# Patient Record
Sex: Female | Born: 1985 | Race: Black or African American | Hispanic: No | Marital: Married | Smoking: Never smoker
Health system: Southern US, Community
[De-identification: ages and names within clinical notes are randomized; demographics above are authoritative.]

## PROBLEM LIST (undated history)

## (undated) ENCOUNTER — Inpatient Hospital Stay (HOSPITAL_COMMUNITY): Payer: Self-pay

## (undated) DIAGNOSIS — K5900 Constipation, unspecified: Secondary | ICD-10-CM

## (undated) DIAGNOSIS — O99119 Other diseases of the blood and blood-forming organs and certain disorders involving the immune mechanism complicating pregnancy, unspecified trimester: Secondary | ICD-10-CM

## (undated) DIAGNOSIS — D696 Thrombocytopenia, unspecified: Secondary | ICD-10-CM

## (undated) DIAGNOSIS — B1910 Unspecified viral hepatitis B without hepatic coma: Secondary | ICD-10-CM

## (undated) HISTORY — PX: TOOTH EXTRACTION: SUR596

---

## 2013-04-21 ENCOUNTER — Inpatient Hospital Stay (HOSPITAL_COMMUNITY): Payer: Self-pay

## 2013-04-21 ENCOUNTER — Encounter (HOSPITAL_COMMUNITY): Payer: Self-pay | Admitting: *Deleted

## 2013-04-21 ENCOUNTER — Inpatient Hospital Stay (HOSPITAL_COMMUNITY)
Admission: AD | Admit: 2013-04-21 | Discharge: 2013-04-21 | Disposition: A | Discharge status: Home / Self Care | Payer: Self-pay | Source: Ambulatory Visit | Attending: Obstetrics & Gynecology | Admitting: Obstetrics & Gynecology

## 2013-04-21 DIAGNOSIS — R1031 Right lower quadrant pain: Secondary | ICD-10-CM | POA: Insufficient documentation

## 2013-04-21 DIAGNOSIS — O26899 Other specified pregnancy related conditions, unspecified trimester: Secondary | ICD-10-CM

## 2013-04-21 DIAGNOSIS — O209 Hemorrhage in early pregnancy, unspecified: Secondary | ICD-10-CM

## 2013-04-21 DIAGNOSIS — O26859 Spotting complicating pregnancy, unspecified trimester: Secondary | ICD-10-CM | POA: Insufficient documentation

## 2013-04-21 DIAGNOSIS — R1032 Left lower quadrant pain: Secondary | ICD-10-CM | POA: Insufficient documentation

## 2013-04-21 DIAGNOSIS — O99891 Other specified diseases and conditions complicating pregnancy: Secondary | ICD-10-CM | POA: Insufficient documentation

## 2013-04-21 DIAGNOSIS — Z3201 Encounter for pregnancy test, result positive: Secondary | ICD-10-CM

## 2013-04-21 HISTORY — DX: Unspecified viral hepatitis B without hepatic coma: B19.10

## 2013-04-21 HISTORY — DX: Constipation, unspecified: K59.00

## 2013-04-21 LAB — URINALYSIS, ROUTINE W REFLEX MICROSCOPIC
Bilirubin Urine: NEGATIVE
Hgb urine dipstick: NEGATIVE
Ketones, ur: NEGATIVE mg/dL
Leukocytes, UA: NEGATIVE
Nitrite: NEGATIVE
Protein, ur: NEGATIVE mg/dL
Specific Gravity, Urine: 1.025 (ref 1.005–1.030)
Urobilinogen, UA: 0.2 mg/dL (ref 0.0–1.0)

## 2013-04-21 LAB — CBC
HCT: 35 % — ABNORMAL LOW (ref 36.0–46.0)
MCH: 25.7 pg — ABNORMAL LOW (ref 26.0–34.0)
MCHC: 33.7 g/dL (ref 30.0–36.0)
MCV: 76.3 fL — ABNORMAL LOW (ref 78.0–100.0)
Platelets: 115 10*3/uL — ABNORMAL LOW (ref 150–400)
RBC: 4.59 MIL/uL (ref 3.87–5.11)
RDW: 13.5 % (ref 11.5–15.5)
WBC: 5.3 10*3/uL (ref 4.0–10.5)

## 2013-04-21 LAB — OB RESULTS CONSOLE GC/CHLAMYDIA
CHLAMYDIA, DNA PROBE: NEGATIVE
GC PROBE AMP, GENITAL: NEGATIVE

## 2013-04-21 LAB — WET PREP, GENITAL
Trich, Wet Prep: NONE SEEN
Yeast Wet Prep HPF POC: NONE SEEN

## 2013-04-21 NOTE — MAU Provider Note (Signed)
Attestation of Attending Supervision of Advanced Practitioner (PA/CNM/NP): Evaluation and management procedures were performed by the Advanced Practitioner under my supervision and collaboration.  I have reviewed the Advanced Practitioner's note and chart, and I agree with the management and plan.  Vedha Tercero, MD, FACOG Attending Obstetrician & Gynecologist Faculty Practice, [facility]  

## 2013-04-21 NOTE — MAU Note (Signed)
Patient states she has had a positive home pregnancy test. States she has had pink spotting every day for the past 4 days between 0500-0600. Has not bleeding at this time or any other times during the day. Has some mild pain off and on but none now.

## 2013-04-21 NOTE — MAU Provider Note (Signed)
History     CSN: [id number]  Arrival date and time: 04/21/13 4098   First Provider Initiated Contact with Patient 04/21/13 1058      Chief Complaint  Patient presents with  . Possible Pregnancy  . Vaginal Bleeding   HPI  Andrea Serrano is a 27 y.o. [id number] [redacted]w[redacted]d with a history of two elective abortions, 5 years of regular "constipation," and hepatitis B. She presents today endorsing 4 days of morning vaginal spotting which she describes as "only a few small pink drops in the morning." She had a positive home urine pregnancy test which was confirmed with in-house urine pregnancy screening at triage. LMP 03/22/13. The patient also endorses that she has experienced transient LLQ and RLQ pain that seems to alternate from side to side and come and go. She is not experiencing any abdominal pain that the time of presentation. She wants to keep this pregnancy, but does not seem to understand much about prenatal care--this was discussed, as well as the clinic at [facility]. The patient endorses a monogamous relationship with her husband of three years. She has not had Pap testing ever previously. She denies any medications; prescription or OTC. Patient family history remarkable for paternal HTN. The patient drinks socially occasionally, is a never smoker, and does not endorse illicit drug use.   OB History   Grav Para Term Preterm Abortions TAB SAB Ect Mult Living   3    2 2     0      Past Medical History  Diagnosis Date  . Hepatitis B   . Constipation     Past Surgical History  Procedure Laterality Date  . Tooth extraction      History reviewed. No pertinent family history.  History  Substance Use Topics  . Smoking status: Never Smoker   . Smokeless tobacco: Never Used  . Alcohol Use: Yes     Comment: occas.    Allergies: No Known Allergies  No prescriptions prior to admission   Results for orders placed during the hospital encounter of 04/21/13 (from the past 24  hour(s))  URINALYSIS, ROUTINE W REFLEX MICROSCOPIC     Status: Abnormal   Collection Time    04/21/13 10:05 AM      Result Value Range   Color, Urine YELLOW  YELLOW   APPearance HAZY (*) CLEAR   Specific Gravity, Urine 1.025  1.005 - 1.030   pH 5.5  5.0 - 8.0   Glucose, UA NEGATIVE  NEGATIVE mg/dL   Hgb urine dipstick NEGATIVE  NEGATIVE   Bilirubin Urine NEGATIVE  NEGATIVE   Ketones, ur NEGATIVE  NEGATIVE mg/dL   Protein, ur NEGATIVE  NEGATIVE mg/dL   Urobilinogen, UA 0.2  0.0 - 1.0 mg/dL   Nitrite NEGATIVE  NEGATIVE   Leukocytes, UA NEGATIVE  NEGATIVE  POCT PREGNANCY, URINE     Status: Abnormal   Collection Time    04/21/13 10:16 AM      Result Value Range   Preg Test, Ur POSITIVE (*) NEGATIVE  CBC     Status: Abnormal   Collection Time    04/21/13 11:17 AM      Result Value Range   WBC 5.3  4.0 - 10.5 K/uL   RBC 4.59  3.87 - 5.11 MIL/uL   Hemoglobin 11.8 (*) 12.0 - 15.0 g/dL   HCT 29.5 (*) 62.1 - 30.8 %   MCV 76.3 (*) 78.0 - 100.0 fL   MCH 25.7 (*) 26.0 - 34.0 pg  MCHC 33.7  30.0 - 36.0 g/dL   RDW 14.7  82.9 - 56.2 %   Platelets 115 (*) 150 - 400 K/uL  ABO/RH     Status: None   Collection Time    04/21/13 11:17 AM      Result Value Range   ABO/RH(D) O POS    HCG, QUANTITATIVE, PREGNANCY     Status: Abnormal   Collection Time    04/21/13 11:17 AM      Result Value Range   hCG, Beta Chain, Quant, S 241 (*) <5 mIU/mL  WET PREP, GENITAL     Status: Abnormal   Collection Time    04/21/13 11:31 AM      Result Value Range   Yeast Wet Prep HPF POC NONE SEEN  NONE SEEN   Trich, Wet Prep NONE SEEN  NONE SEEN   Clue Cells Wet Prep HPF POC NONE SEEN  NONE SEEN   WBC, Wet Prep HPF POC FEW (*) NONE SEEN    US Ob Comp Less 14 Wks  04/21/2013   CLINICAL DATA:  Vaginal bleeding, abdominal pain.  EXAM: OBSTETRIC <14 WK [place] AND TRANSVAGINAL OB US  TECHNIQUE: Both transabdominal and transvaginal ultrasound examinations were performed for complete evaluation of the gestation as  well as the maternal uterus, adnexal regions, and pelvic cul-de-sac. Transvaginal technique was performed to assess early pregnancy.  COMPARISON:  None.  FINDINGS: Intrauterine gestational sac: None visualized  Yolk sac:  None visualized  Embryo:  None visualized  Maternal uterus/adnexae: Possible 1.4 cm Gardner cyst in the region of the vaginal cuff. Small follicles in the ovaries bilaterally. No adnexal masses. No free fluid.  IMPRESSION: No intrauterine pregnancy visualized. Differential considerations would include early intrauterine pregnancy too early to visualize, spontaneous abortion, or occult ectopic pregnancy. Recommend close clinical followup and serial quantitative beta HCGs and ultrasounds.   Electronically Signed   By: Charlett Nose M.D.   On: 04/21/2013 12:48   US Ob Transvaginal  04/21/2013   CLINICAL DATA:  Vaginal bleeding, abdominal pain.  EXAM: OBSTETRIC <14 WK [place] AND TRANSVAGINAL OB US  TECHNIQUE: Both transabdominal and transvaginal ultrasound examinations were performed for complete evaluation of the gestation as well as the maternal uterus, adnexal regions, and pelvic cul-de-sac. Transvaginal technique was performed to assess early pregnancy.  COMPARISON:  None.  FINDINGS: Intrauterine gestational sac: None visualized  Yolk sac:  None visualized  Embryo:  None visualized  Maternal uterus/adnexae: Possible 1.4 cm Gardner cyst in the region of the vaginal cuff. Small follicles in the ovaries bilaterally. No adnexal masses. No free fluid.  IMPRESSION: No intrauterine pregnancy visualized. Differential considerations would include early intrauterine pregnancy too early to visualize, spontaneous abortion, or occult ectopic pregnancy. Recommend close clinical followup and serial quantitative beta HCGs and ultrasounds.   Electronically Signed   By: Charlett Nose M.D.   On: 04/21/2013 12:48    Review of Systems  Constitutional: Negative.  Negative for fever, chills, malaise/fatigue and  diaphoresis.  HENT: Negative.  Negative for sore throat.   Eyes: Negative.  Negative for blurred vision and double vision.  Respiratory: Negative.  Negative for cough, shortness of breath and wheezing.   Cardiovascular: Negative.  Negative for chest pain, palpitations and claudication.  Gastrointestinal: Positive for abdominal pain (see HPI) and constipation (5 years - last BM Saturday (~72h)). Negative for heartburn, nausea, vomiting, diarrhea, blood in stool and melena.  Genitourinary: Negative.  Negative for dysuria, urgency, frequency, hematuria and flank pain.  Musculoskeletal: Negative.   Skin: Negative.  Negative for itching and rash.  Neurological: Negative.  Negative for dizziness, weakness and headaches.  Endo/Heme/Allergies: Negative.   Psychiatric/Behavioral: Negative.    Physical Exam   Blood pressure 117/84, pulse 81, temperature 98.5 F (36.9 C), temperature source Oral, resp. rate 16, height 5' 2.5" (1.588 m), weight 72.303 kg (159 lb 6.4 oz), last menstrual period 03/22/2013, SpO2 100.00%.  Physical Exam  Constitutional: She is oriented to person, place, and time. She appears well-developed and well-nourished.  HENT:  Head: Normocephalic and atraumatic.  Right Ear: External ear normal.  Left Ear: External ear normal.  Eyes: Conjunctivae are normal. Right eye exhibits no discharge. Left eye exhibits no discharge.  Neck: No JVD present. No tracheal deviation present.  Cardiovascular: Normal rate, normal heart sounds and intact distal pulses.  Exam reveals no gallop and no friction rub.   No murmur heard. Respiratory: Effort normal and breath sounds normal. No stridor. No respiratory distress. She has no wheezes. She has no rales. She exhibits no tenderness.  GI: Soft. Bowel sounds are normal. She exhibits no distension and no mass. There is no tenderness. There is no rebound and no guarding.  Genitourinary:  Speculum exam: Normal female genitalia; superficial anogenital  region presents without lesions or abnormalities. Inferomedial portion of cervix appears irritated with multiple erythematous lesions. No gross blood. Trace thin white discharge noted in vaginal canal.  Bimanual Exam: Positive for "chandelier sign" when evaluating cervical motion tenderness. No adnexal tenderness. No blood noted on exam.  Musculoskeletal: Normal range of motion.  Neurological: She is alert and oriented to person, place, and time.  Skin: Skin is warm and dry. No rash noted. She is not diaphoretic. No erythema. No pallor.  Psychiatric: She has a normal mood and affect. Her behavior is normal. Thought content normal.    MAU Course  Procedures None  MDM -G&C -Wet Prep -Speculum / Bimanual Exam -Ectopic Pregnancy Protocol  Assessment and Plan  A: Positive serum pregnancy test [redacted]w[redacted]d based on LMP  Abdominal pain   P:  Discharge home Ectopic precautions discussed  Return In 48 hours for repeat beta hcg level Return to MAU for worsening bleeding or pain.    Jefm Petty 04/21/2013, 11:16 AM   Evaluation and management procedures were performed by the under my supervision and collaboration. I have reviewed the note and chart, and I agree with the management and plan.  Iona Hansen Rasch, NP 04/21/2013 2:42 PM

## 2013-04-22 LAB — GC/CHLAMYDIA PROBE AMP
CT Probe RNA: NEGATIVE
GC Probe RNA: NEGATIVE

## 2013-04-23 ENCOUNTER — Inpatient Hospital Stay (HOSPITAL_COMMUNITY)
Admission: AD | Admit: 2013-04-23 | Discharge: 2013-04-23 | Disposition: A | Discharge status: Home / Self Care | Payer: Self-pay | Source: Ambulatory Visit | Attending: Obstetrics & Gynecology | Admitting: Obstetrics & Gynecology

## 2013-04-23 DIAGNOSIS — Z3201 Encounter for pregnancy test, result positive: Secondary | ICD-10-CM

## 2013-04-23 DIAGNOSIS — O99891 Other specified diseases and conditions complicating pregnancy: Secondary | ICD-10-CM | POA: Insufficient documentation

## 2013-04-23 NOTE — MAU Provider Note (Signed)
S: 27 y.o. [id number] @[redacted]w[redacted]d  presents to MAU for repeat quant hcg.   She denies pain or vaginal bleeding today.    O: BP 118/68  Pulse 78  Temp(Src) 98.9 F (37.2 C) (Oral)  Resp 16  SpO2 100%  LMP 03/22/2013  HCG, QUANTITATIVE, PREGNANCY   Collection Time    04/23/13 11:20 AM      Result Value Range   hCG, Beta Chain, Quant, S 586 (*) <5 mIU/mL  POCT PREGNANCY, URINE   Collection Time    04/21/13 10:16 AM      Result Value Range   hCG, Beta Chain, Quant, S 241 (*) <5 mIU/mL  ABO/RH   Collection Time    04/21/13 11:17 AM      Result Value Range   ABO/RH(D) O POS     A: Appropriate rise in hcg in 48 hours  P: D/C home with ectopic precautions Outpatient U/S in 1 week Return to MAU as needed  Sharen Counter Certified Nurse-Midwife

## 2013-04-23 NOTE — MAU Note (Signed)
Patient to MAU for repeat BHCG. Patient denies bleeding or pain.  

## 2013-06-06 ENCOUNTER — Inpatient Hospital Stay (HOSPITAL_COMMUNITY)
Admission: AD | Admit: 2013-06-06 | Discharge: 2013-06-06 | Disposition: A | Discharge status: Home / Self Care | Payer: Medicaid Other | Source: Ambulatory Visit | Attending: Obstetrics & Gynecology | Admitting: Obstetrics & Gynecology

## 2013-06-06 ENCOUNTER — Encounter (HOSPITAL_COMMUNITY): Payer: Self-pay | Admitting: Family

## 2013-06-06 ENCOUNTER — Inpatient Hospital Stay (HOSPITAL_COMMUNITY): Payer: Medicaid Other

## 2013-06-06 DIAGNOSIS — O210 Mild hyperemesis gravidarum: Secondary | ICD-10-CM | POA: Insufficient documentation

## 2013-06-06 DIAGNOSIS — R509 Fever, unspecified: Secondary | ICD-10-CM | POA: Insufficient documentation

## 2013-06-06 DIAGNOSIS — B349 Viral infection, unspecified: Secondary | ICD-10-CM

## 2013-06-06 DIAGNOSIS — O99891 Other specified diseases and conditions complicating pregnancy: Secondary | ICD-10-CM | POA: Insufficient documentation

## 2013-06-06 DIAGNOSIS — B9789 Other viral agents as the cause of diseases classified elsewhere: Secondary | ICD-10-CM | POA: Insufficient documentation

## 2013-06-06 LAB — CBC
HCT: 36.5 % (ref 36.0–46.0)
MCH: 26.4 pg (ref 26.0–34.0)
MCHC: 34.5 g/dL (ref 30.0–36.0)
MCV: 76.4 fL — ABNORMAL LOW (ref 78.0–100.0)
Platelets: 118 10*3/uL — ABNORMAL LOW (ref 150–400)
RDW: 14.4 % (ref 11.5–15.5)

## 2013-06-06 LAB — URINALYSIS, ROUTINE W REFLEX MICROSCOPIC
Bilirubin Urine: NEGATIVE
Glucose, UA: NEGATIVE mg/dL
Hgb urine dipstick: NEGATIVE
Ketones, ur: NEGATIVE mg/dL
Protein, ur: NEGATIVE mg/dL
Urobilinogen, UA: 1 mg/dL (ref 0.0–1.0)

## 2013-06-06 MED ORDER — ONDANSETRON HCL 4 MG PO TABS
8.0000 mg | ORAL_TABLET | Freq: Once | ORAL | Status: AC
  Administered 2013-06-06: 8 mg via ORAL
  Filled 2013-06-06: qty 2

## 2013-06-06 MED ORDER — ONDANSETRON HCL 4 MG PO TABS: 4.0000 mg | ORAL_TABLET | Freq: Three times a day (TID) | ORAL | Status: DC | PRN

## 2013-06-06 NOTE — MAU Provider Note (Signed)
History     CSN: [id number]  Arrival date and time: 06/06/13 1358   First Provider Initiated Contact with Patient 06/06/13 1456      No chief complaint on file.  HPI THis is a 27 y.o. female who presents with c/o fever for 1-2 weeks. States it has been 38.0 C at times but this morning was back to 37.  Has had nausea and vomiting for several weeks, nothing new. Denies other symptoms. Denies history of Malaria.  Last in [place] 2 yrs ago. Seen a month ago for early pregnancy pain. Never went anywhere for followup .   RN Note  27 yo, G3P0 at [redacted]w[redacted]d, presents to MAU with c/o n/v for last week; reports n/v for one month earlier in pregnancy, which subsided for one week, then returned. Reports she was throwing up once per day, now it is 3-4 x per day.  No medications. Denies VB, vaginal discharge changes, cramping.       OB History   Grav Para Term Preterm Abortions TAB SAB Ect Mult Living   3    2 2     0      Past Medical History  Diagnosis Date  . Hepatitis B   . Constipation     Past Surgical History  Procedure Laterality Date  . Tooth extraction      History reviewed. No pertinent family history.  History  Substance Use Topics  . Smoking status: Never Smoker   . Smokeless tobacco: Never Used  . Alcohol Use: Yes     Comment: occas.    Allergies: No Known Allergies  No prescriptions prior to admission    Review of Systems  Constitutional: Positive for fever. Negative for chills and malaise/fatigue.  HENT: Negative for sore throat.   Gastrointestinal: Positive for nausea and vomiting. Negative for abdominal pain, diarrhea and constipation.  Genitourinary: Negative for dysuria.  Musculoskeletal: Negative for myalgias.  Neurological: Negative for dizziness and headaches.   Physical Exam   Blood pressure 127/84, pulse 87, temperature 98.4 F (36.9 C), temperature source Oral, resp. rate 16, last menstrual period 03/22/2013.  Physical Exam  Constitutional: She  is oriented to person, place, and time. She appears well-developed and well-nourished. No distress.  HENT:  Head: Normocephalic.  Cardiovascular: Normal rate.   Respiratory: Effort normal.  GI: Soft. She exhibits no distension. There is no tenderness. There is no rebound and no guarding.  Musculoskeletal: Normal range of motion.  Neurological: She is alert and oriented to person, place, and time.  Skin: Skin is warm and dry.  Psychiatric: She has a normal mood and affect.    MAU Course  Procedures  MDM Zofran given with relief of nausea. US done: US Ob Comp Less 14 Wks  06/06/2013   CLINICAL DATA:  Positive pregnancy test, no intrauterine gestational sac identified at prior exam 04/21/2013. Followup.  EXAM: OBSTETRIC <14 WK ULTRASOUND  TECHNIQUE: Transabdominal ultrasound was performed for evaluation of the gestation as well as the maternal uterus and adnexal regions.  COMPARISON:  04/21/2013  FINDINGS: Intrauterine gestational sac: Visualized/normal in shape.  Yolk sac:  Not visualized  Embryo:  Visualized  Cardiac Activity: Visualized  Heart Rate: 170 bpm  CRL:   43  mm   11 w 2d                  [place] EDC: 12/24/13  Maternal uterus/adnexae: The ovaries are normal. Multiple predominantly intramural fibroids are identified, largest 1.4 x 1.2 x 1.0  cm, intramural, right lateral uterine body.    IMPRESSION: Single live intrauterine gestation measuring 11 weeks 2 days by today's exam which is concordant with the assigned gestational age by LMP of 10 weeks 6 days.     Electronically Signed   By: Christiana Pellant M.D.   On: 06/06/2013 17:04   Results for orders placed during the hospital encounter of 06/06/13 (from the past 24 hour(s))  URINALYSIS, ROUTINE W REFLEX MICROSCOPIC     Status: None   Collection Time    06/06/13  2:15 PM      Result Value Range   Color, Urine YELLOW  YELLOW   APPearance CLEAR  CLEAR   Specific Gravity, Urine 1.020  1.005 - 1.030   pH 7.5  5.0 - 8.0   Glucose, UA  NEGATIVE  NEGATIVE mg/dL   Hgb urine dipstick NEGATIVE  NEGATIVE   Bilirubin Urine NEGATIVE  NEGATIVE   Ketones, ur NEGATIVE  NEGATIVE mg/dL   Protein, ur NEGATIVE  NEGATIVE mg/dL   Urobilinogen, UA 1.0  0.0 - 1.0 mg/dL   Nitrite NEGATIVE  NEGATIVE   Leukocytes, UA NEGATIVE  NEGATIVE  CBC     Status: Abnormal   Collection Time    06/06/13  3:30 PM      Result Value Range   WBC 7.6  4.0 - 10.5 K/uL   RBC 4.78  3.87 - 5.11 MIL/uL   Hemoglobin 12.6  12.0 - 15.0 g/dL   HCT 16.1  09.6 - 04.5 %   MCV 76.4 (*) 78.0 - 100.0 fL   MCH 26.4  26.0 - 34.0 pg   MCHC 34.5  30.0 - 36.0 g/dL   RDW 40.9  81.1 - 91.4 %   Platelets 118 (*) 150 - 400 K/uL    Assessment and Plan  A:  SIUP at [redacted]w[redacted]d / 11.2 weeks by [place]      Viral illness most likely, given normal WBC      N/V of pregnancy  P:  Discharge home      Rx Zofran for home PRN use      Will message clinic for new ob appt. Pt seems at a loss for what to do to get a prenatal care provider.        [name] 06/06/2013, 2:57 PM

## 2013-06-06 NOTE — MAU Provider Note (Signed)
Attestation of Attending Supervision of Advanced Practitioner (CNM/NP): Evaluation and management procedures were performed by the Advanced Practitioner under my supervision and collaboration. I have reviewed the Advanced Practitioner's note and chart, and I agree with the management and plan.  Eriona Kinchen H. 6:27 PM

## 2013-06-06 NOTE — MAU Note (Signed)
27 yo, G3P0 at [redacted]w[redacted]d, presents to MAU with c/o n/v for last week; reports n/v for one month earlier in pregnancy, which subsided for one week, then returned. Reports she was throwing up once per day, now it is 3-4 x per day.  No medications. Denies VB, vaginal discharge changes, cramping.

## 2013-06-17 ENCOUNTER — Encounter: Payer: Self-pay | Admitting: Obstetrics & Gynecology

## 2013-07-14 ENCOUNTER — Encounter: Payer: Self-pay | Admitting: Family Medicine

## 2013-07-14 ENCOUNTER — Ambulatory Visit (INDEPENDENT_AMBULATORY_CARE_PROVIDER_SITE_OTHER): Payer: Medicaid Other | Admitting: Family Medicine

## 2013-07-14 ENCOUNTER — Other Ambulatory Visit (HOSPITAL_COMMUNITY)
Admission: RE | Admit: 2013-07-14 | Discharge: 2013-07-14 | Disposition: A | Discharge status: Home / Self Care | Payer: Medicaid Other | Source: Ambulatory Visit | Attending: Family Medicine | Admitting: Family Medicine

## 2013-07-14 VITALS — BP 115/75 | Temp 97.8°F | Wt 164.9 lb

## 2013-07-14 DIAGNOSIS — Z23 Encounter for immunization: Secondary | ICD-10-CM

## 2013-07-14 DIAGNOSIS — Z01419 Encounter for gynecological examination (general) (routine) without abnormal findings: Secondary | ICD-10-CM | POA: Insufficient documentation

## 2013-07-14 DIAGNOSIS — Z8619 Personal history of other infectious and parasitic diseases: Secondary | ICD-10-CM

## 2013-07-14 DIAGNOSIS — Z3480 Encounter for supervision of other normal pregnancy, unspecified trimester: Secondary | ICD-10-CM | POA: Insufficient documentation

## 2013-07-14 DIAGNOSIS — K759 Inflammatory liver disease, unspecified: Secondary | ICD-10-CM

## 2013-07-14 LAB — POCT URINALYSIS DIP (DEVICE)
Bilirubin Urine: NEGATIVE
Glucose, UA: NEGATIVE mg/dL
Hgb urine dipstick: NEGATIVE
Ketones, ur: NEGATIVE mg/dL
Nitrite: NEGATIVE
Protein, ur: 30 mg/dL — AB
SPECIFIC GRAVITY, URINE: 1.02 (ref 1.005–1.030)
UROBILINOGEN UA: 1 mg/dL (ref 0.0–1.0)
pH: 8.5 — ABNORMAL HIGH (ref 5.0–8.0)

## 2013-07-14 MED ORDER — ONDANSETRON HCL 4 MG PO TABS: 4.0000 mg | ORAL_TABLET | Freq: Three times a day (TID) | ORAL | Status: DC | PRN

## 2013-07-14 MED ORDER — PRENATAL VITAMINS 0.8 MG PO TABS: 1.0000 | ORAL_TABLET | Freq: Every day | ORAL | Status: AC

## 2013-07-14 NOTE — Progress Notes (Signed)
P=85, Here for initial prenatal visit. Not sure of weight prior to pregnancy.  Given new pregnancy information.

## 2013-07-14 NOTE — Progress Notes (Signed)
  Subjective:    Andrea Serrano is a G3P0020 [redacted]w[redacted]d being seen today for her first obstetrical visit.  Her obstetrical history is significant for two previous tabs and hx per pt report of hepatitis B although not on treatment and no records obtained.  Patient does intend to breast feed. Married. Planned pregnancy.  Pregnancy history fully reviewed.  Patient reports fatigue. Some morning nausea and vomiting but controlled on zofran.  No bleeding.   Filed Vitals:   07/14/13 1314  BP: 115/75  Temp: 97.8 F (36.6 C)  Weight: 164 lb 14.4 oz (74.798 kg)    HISTORY: OB History  Gravida Para Term Preterm AB SAB TAB Ectopic Multiple Living  3    2  2    0    # Outcome Date GA Lbr Len/2nd Weight Sex Delivery Anes PTL Lv  3 CUR           2 TAB 2010          1 TAB 2002             Past Medical History  Diagnosis Date  . Hepatitis B   . Constipation    Past Surgical History  Procedure Laterality Date  . Tooth extraction     Family History  Problem Relation Age of Onset  . Hypertension Mother   . Hypertension Father   . Heart disease Father      Exam    Uterus:     Pelvic Exam:    Perineum: No Hemorrhoids   Vulva: normal   Vagina:  normal mucosa, normal discharge   Cervix: no cervical motion tenderness and nulliparous appearance   Adnexa: normal adnexa and no mass, fullness, tenderness  System: Breast:  deferred   Skin: normal coloration and turgor, no rashes    Neurologic: normal   Extremities: normal strength, tone, and muscle mass   HEENT PERRLA, extra ocular movement intact and thyroid without masses   Mouth/Teeth mucous membranes moist, pharynx normal without lesions   Neck supple   Cardiovascular: regular rate and rhythm   Respiratory:  chest clear, no wheezing, crepitations, rhonchi, normal symmetric air entry   Abdomen: soft, non-tender; bowel sounds normal; no masses,  no organomegaly   Urinary: urethral meatus normal      Assessment:    Pregnancy:  [id number] Patient Active Problem List   Diagnosis Date Noted  . Supervision of normal subsequent pregnancy 07/14/2013        Plan:     Initial labs drawn. PAP done Prenatal vitamins. Hx of hepatits B per report- will send hepatitis panel and CMP today  Problem list reviewed and updated. Genetic Screening discussed Quad Screen: ordered.  Ultrasound discussed; fetal survey: requested.  Follow up in 4 weeks. 50% of 30 min visit spent on counseling and coordination of care.     Breeona Waid L 07/14/2013

## 2013-07-14 NOTE — Patient Instructions (Signed)
Second Trimester of Pregnancy The second trimester is from week 13 through week 28, months 4 through 6. The second trimester is often a time when you feel your best. Your body has also adjusted to being pregnant, and you begin to feel better physically. Usually, morning sickness has lessened or quit completely, you may have more energy, and you may have an increase in appetite. The second trimester is also a time when the fetus is growing rapidly. At the end of the sixth month, the fetus is about 9 inches long and weighs about 1 pounds. You will likely begin to feel the baby move (quickening) between 18 and 20 weeks of the pregnancy. BODY CHANGES Your body goes through many changes during pregnancy. The changes vary from woman to woman.   Your weight will continue to increase. You will notice your lower abdomen bulging out.  You may begin to get stretch marks on your hips, abdomen, and breasts.  You may develop headaches that can be relieved by medicines approved by your caregiver.  You may urinate more often because the fetus is pressing on your bladder.  You may develop or continue to have heartburn as a result of your pregnancy.  You may develop constipation because certain hormones are causing the muscles that push waste through your intestines to slow down.  You may develop hemorrhoids or swollen, bulging veins (varicose veins).  You may have back pain because of the weight gain and pregnancy hormones relaxing your joints between the bones in your pelvis and as a result of a shift in weight and the muscles that support your balance.  Your breasts will continue to grow and be tender.  Your gums may bleed and may be sensitive to brushing and flossing.  Dark spots or blotches (chloasma, mask of pregnancy) may develop on your face. This will likely fade after the baby is born.  A dark line from your belly button to the pubic area (linea nigra) may appear. This will likely fade after the  baby is born. WHAT TO EXPECT AT YOUR PRENATAL VISITS During a routine prenatal visit:  You will be weighed to make sure you and the fetus are growing normally.  Your blood pressure will be taken.  Your abdomen will be measured to track your baby's growth.  The fetal heartbeat will be listened to.  Any test results from the previous visit will be discussed. Your caregiver may ask you:  How you are feeling.  If you are feeling the baby move.  If you have had any abnormal symptoms, such as leaking fluid, bleeding, severe headaches, or abdominal cramping.  If you have any questions. Other tests that may be performed during your second trimester include:  Blood tests that check for:  Low iron levels (anemia).  Gestational diabetes (between 24 and 28 weeks).  Rh antibodies.  Urine tests to check for infections, diabetes, or protein in the urine.  An ultrasound to confirm the proper growth and development of the baby.  An amniocentesis to check for possible genetic problems.  Fetal screens for spina bifida and Down syndrome. HOME CARE INSTRUCTIONS   Avoid all smoking, herbs, alcohol, and unprescribed drugs. These chemicals affect the formation and growth of the baby.  Follow your caregiver's instructions regarding medicine use. There are medicines that are either safe or unsafe to take during pregnancy.  Exercise only as directed by your caregiver. Experiencing uterine cramps is a good sign to stop exercising.  Continue to eat regular,   healthy meals.  Wear a good support bra for breast tenderness.  Do not use hot tubs, steam rooms, or saunas.  Wear your seat belt at all times when driving.  Avoid raw meat, uncooked cheese, cat litter boxes, and soil used by cats. These carry germs that can cause birth defects in the baby.  Take your prenatal vitamins.  Try taking a stool softener (if your caregiver approves) if you develop constipation. Eat more high-fiber foods,  such as fresh vegetables or fruit and whole grains. Drink plenty of fluids to keep your urine clear or pale yellow.  Take warm sitz baths to soothe any pain or discomfort caused by hemorrhoids. Use hemorrhoid cream if your caregiver approves.  If you develop varicose veins, wear support hose. Elevate your feet for 15 minutes, 3 4 times a day. Limit salt in your diet.  Avoid heavy lifting, wear low heel shoes, and practice good posture.  Rest with your legs elevated if you have leg cramps or low back pain.  Visit your dentist if you have not gone yet during your pregnancy. Use a soft toothbrush to brush your teeth and be gentle when you floss.  A sexual relationship may be continued unless your caregiver directs you otherwise.  Continue to go to all your prenatal visits as directed by your caregiver. SEEK MEDICAL CARE IF:   You have dizziness.  You have mild pelvic cramps, pelvic pressure, or nagging pain in the abdominal area.  You have persistent nausea, vomiting, or diarrhea.  You have a bad smelling vaginal discharge.  You have pain with urination. SEEK IMMEDIATE MEDICAL CARE IF:   You have a fever.  You are leaking fluid from your vagina.  You have spotting or bleeding from your vagina.  You have severe abdominal cramping or pain.  You have rapid weight gain or loss.  You have shortness of breath with chest pain.  You notice sudden or extreme swelling of your face, hands, ankles, feet, or legs.  You have not felt your baby move in over an hour.  You have severe headaches that do not go away with medicine.  You have vision changes. Document Released: 05/22/2001 Document Revised: 01/28/2013 Document Reviewed: 07/29/2012 [employer] Patient Information 2014 [employer].  

## 2013-07-15 LAB — OBSTETRIC PANEL
Antibody Screen: NEGATIVE
BASOS ABS: 0 10*3/uL (ref 0.0–0.1)
Basophils Relative: 0 % (ref 0–1)
EOS PCT: 1 % (ref 0–5)
Eosinophils Absolute: 0.1 10*3/uL (ref 0.0–0.7)
HCT: 35.6 % — ABNORMAL LOW (ref 36.0–46.0)
Hemoglobin: 12 g/dL (ref 12.0–15.0)
Hepatitis B Surface Ag: POSITIVE — AB
LYMPHS PCT: 24 % (ref 12–46)
Lymphs Abs: 1.7 10*3/uL (ref 0.7–4.0)
MCH: 26.5 pg (ref 26.0–34.0)
MCHC: 33.7 g/dL (ref 30.0–36.0)
MCV: 78.6 fL (ref 78.0–100.0)
Monocytes Absolute: 0.5 10*3/uL (ref 0.1–1.0)
Monocytes Relative: 6 % (ref 3–12)
NEUTROS ABS: 4.9 10*3/uL (ref 1.7–7.7)
Neutrophils Relative %: 69 % (ref 43–77)
PLATELETS: 122 10*3/uL — AB (ref 150–400)
RBC: 4.53 MIL/uL (ref 3.87–5.11)
RDW: 15.4 % (ref 11.5–15.5)
Rh Type: POSITIVE
Rubella: 19.2 Index — ABNORMAL HIGH (ref <0.90)
WBC: 7.1 10*3/uL (ref 4.0–10.5)

## 2013-07-15 LAB — COMPREHENSIVE METABOLIC PANEL
ALK PHOS: 47 U/L (ref 39–117)
ALT: 75 U/L — ABNORMAL HIGH (ref 0–35)
AST: 32 U/L (ref 0–37)
Albumin: 3.7 g/dL (ref 3.5–5.2)
BUN: 8 mg/dL (ref 6–23)
CALCIUM: 9.4 mg/dL (ref 8.4–10.5)
CHLORIDE: 104 meq/L (ref 96–112)
CO2: 21 mEq/L (ref 19–32)
CREATININE: 0.5 mg/dL (ref 0.50–1.10)
Glucose, Bld: 75 mg/dL (ref 70–99)
Potassium: 3.8 mEq/L (ref 3.5–5.3)
Sodium: 134 mEq/L — ABNORMAL LOW (ref 135–145)
Total Bilirubin: 0.5 mg/dL (ref 0.2–1.2)
Total Protein: 6.7 g/dL (ref 6.0–8.3)

## 2013-07-15 LAB — AFP, QUAD SCREEN
AFP: 59.9 IU/mL
Age Alone: 1:796 {titer}
Curr Gest Age: 23.2 wks.days
Down Syndrome Scr Risk Est: 1:759 {titer}
HCG TOTAL: 19999 m[IU]/mL
INH: 127.1 pg/mL
Interpretation-AFP: NEGATIVE
MoM for AFP: 0.77
MoM for INH: 0.56
MoM for hCG: 1.85
Open Spina bifida: NEGATIVE
Osb Risk: <1:54600 {titer}
TRI 18 SCR RISK EST: NEGATIVE
UE3 VALUE: 0.5 ng/mL
uE3 Mom: 0.25

## 2013-07-15 LAB — HEPATITIS PANEL, ACUTE
HCV AB: NEGATIVE
HEP B C IGM: NONREACTIVE
Hep A IgM: NONREACTIVE
Hepatitis B Surface Ag: POSITIVE — AB

## 2013-07-15 LAB — PRESCRIPTION MONITORING PROFILE (19 PANEL)
Amphetamine/Meth: NEGATIVE ng/mL
Barbiturate Screen, Urine: NEGATIVE ng/mL
Benzodiazepine Screen, Urine: NEGATIVE ng/mL
Buprenorphine, Urine: NEGATIVE ng/mL
CANNABINOID SCRN UR: NEGATIVE ng/mL
CREATININE, URINE: 142.6 mg/dL (ref >20.0)
Carisoprodol, Urine: NEGATIVE ng/mL
Cocaine Metabolites: NEGATIVE ng/mL
ECSTASY: NEGATIVE ng/mL
FENTANYL URINE: NEGATIVE ng/mL
METHADONE SCREEN, URINE: NEGATIVE ng/mL
Meperidine, Ur: NEGATIVE ng/mL
Methaqualone: NEGATIVE ng/mL
Nitrites, Initial: NEGATIVE ug/mL
OXYCODONE SCRN UR: NEGATIVE ng/mL
Opiate Screen, Urine: NEGATIVE ng/mL
PHENCYCLIDINE, UR: NEGATIVE ng/mL
Propoxyphene: NEGATIVE ng/mL
Tapentadol, urine: NEGATIVE ng/mL
Tramadol Scrn, Ur: NEGATIVE ng/mL
Zolpidem, Urine: NEGATIVE ng/mL
pH, Initial: 8.3 pH (ref 4.5–8.9)

## 2013-07-15 LAB — HIV ANTIBODY (ROUTINE TESTING W REFLEX): HIV: NONREACTIVE

## 2013-07-16 LAB — HEMOGLOBINOPATHY EVALUATION
HGB A: 97.6 % (ref 96.8–97.8)
HGB F QUANT: 0 % (ref 0.0–2.0)
HGB S QUANTITAION: 0 %
Hemoglobin Other: 0 %
Hgb A2 Quant: 2.4 % (ref 2.2–3.2)

## 2013-07-16 LAB — CULTURE, OB URINE
COLONY COUNT: NO GROWTH
ORGANISM ID, BACTERIA: NO GROWTH

## 2013-07-23 ENCOUNTER — Other Ambulatory Visit: Payer: Self-pay | Admitting: Family Medicine

## 2013-07-23 ENCOUNTER — Encounter: Payer: Self-pay | Admitting: *Deleted

## 2013-07-23 ENCOUNTER — Telehealth: Payer: Self-pay | Admitting: *Deleted

## 2013-07-23 DIAGNOSIS — O98419 Viral hepatitis complicating pregnancy, unspecified trimester: Principal | ICD-10-CM

## 2013-07-23 DIAGNOSIS — B181 Chronic viral hepatitis B without delta-agent: Secondary | ICD-10-CM

## 2013-07-23 NOTE — Telephone Encounter (Signed)
Called [facility] to make sure referral received.  They will call her with an appointment within the next week. Called Andrea Serrano to notify her to expect a call re: referral to [facility] for + hep B. [name] voices understanding.

## 2013-07-23 NOTE — Telephone Encounter (Signed)
Message copied by Samuel Germany on Thu Jul 23, 2013 12:56 PM ------      Message from: Kassie Mends      Created: Thu Jul 23, 2013 11:35 AM       Pt has a positive HepB surface AG. She knows this diagnosis already. I am placing a referral to [facility]. Can you ladies let her know?  THANKS!! ------

## 2013-08-04 ENCOUNTER — Encounter: Payer: Self-pay | Admitting: Infectious Diseases

## 2013-08-04 ENCOUNTER — Ambulatory Visit (INDEPENDENT_AMBULATORY_CARE_PROVIDER_SITE_OTHER): Payer: Medicaid Other | Admitting: Infectious Diseases

## 2013-08-04 ENCOUNTER — Ambulatory Visit (HOSPITAL_COMMUNITY)
Admission: RE | Admit: 2013-08-04 | Discharge: 2013-08-04 | Disposition: A | Discharge status: Home / Self Care | Payer: Medicaid Other | Source: Ambulatory Visit | Attending: Family Medicine | Admitting: Family Medicine

## 2013-08-04 ENCOUNTER — Other Ambulatory Visit: Payer: Self-pay | Admitting: Family Medicine

## 2013-08-04 VITALS — BP 124/77 | HR 86 | Temp 97.4°F | Wt 171.0 lb

## 2013-08-04 DIAGNOSIS — K759 Inflammatory liver disease, unspecified: Secondary | ICD-10-CM

## 2013-08-04 DIAGNOSIS — Z3480 Encounter for supervision of other normal pregnancy, unspecified trimester: Secondary | ICD-10-CM

## 2013-08-04 DIAGNOSIS — B1910 Unspecified viral hepatitis B without hepatic coma: Secondary | ICD-10-CM

## 2013-08-04 DIAGNOSIS — Z23 Encounter for immunization: Secondary | ICD-10-CM

## 2013-08-04 DIAGNOSIS — Z3689 Encounter for other specified antenatal screening: Secondary | ICD-10-CM | POA: Insufficient documentation

## 2013-08-04 DIAGNOSIS — Z8619 Personal history of other infectious and parasitic diseases: Secondary | ICD-10-CM

## 2013-08-04 DIAGNOSIS — O98419 Viral hepatitis complicating pregnancy, unspecified trimester: Secondary | ICD-10-CM

## 2013-08-04 DIAGNOSIS — B181 Chronic viral hepatitis B without delta-agent: Secondary | ICD-10-CM | POA: Insufficient documentation

## 2013-08-04 LAB — COMPREHENSIVE METABOLIC PANEL
ALT: 91 U/L — ABNORMAL HIGH (ref 0–35)
AST: 41 U/L — ABNORMAL HIGH (ref 0–37)
Albumin: 3.7 g/dL (ref 3.5–5.2)
Alkaline Phosphatase: 57 U/L (ref 39–117)
BUN: 8 mg/dL (ref 6–23)
CALCIUM: 9.2 mg/dL (ref 8.4–10.5)
CO2: 24 meq/L (ref 19–32)
Chloride: 102 mEq/L (ref 96–112)
Creat: 0.54 mg/dL (ref 0.50–1.10)
Glucose, Bld: 97 mg/dL (ref 70–99)
POTASSIUM: 3.5 meq/L (ref 3.5–5.3)
SODIUM: 138 meq/L (ref 135–145)
TOTAL PROTEIN: 6.6 g/dL (ref 6.0–8.3)
Total Bilirubin: 0.3 mg/dL (ref 0.2–1.2)

## 2013-08-04 NOTE — Progress Notes (Signed)
   Subjective:    Patient ID: Andrea Serrano, female    DOB: 06-Aug-1985, 28 y.o.   MRN: [id number]  HPI 28 yo F who is 5 month pregnant. Was dx with Hepatitis B 2012 when she immigrated here from [place].  No problems with her pregnancy.  No icterus, no change in color of BM. No hemmerhoids.   Soc/Fhx reviewed.   Review of Systems  Constitutional: Negative for fever, chills, appetite change and unexpected weight change.  Respiratory: Negative for cough and shortness of breath.   Cardiovascular: Negative for leg swelling.  Gastrointestinal: Negative for diarrhea and constipation.  Genitourinary: Negative for difficulty urinating.  Hematological: Negative for adenopathy. Does not bruise/bleed easily.       Objective:   Physical Exam  Constitutional: She appears well-developed and well-nourished.  HENT:  Mouth/Throat: No oropharyngeal exudate.  Eyes: EOM are normal. Pupils are equal, round, and reactive to light. No scleral icterus.  Neck: Neck supple.  Cardiovascular: Normal rate, regular rhythm and normal heart sounds.   Pulmonary/Chest: Effort normal and breath sounds normal.  Abdominal: Soft. Bowel sounds are normal. She exhibits distension. There is no tenderness.  gravid  Musculoskeletal: She exhibits no edema.  Lymphadenopathy:    She has no cervical adenopathy.          Assessment & Plan:

## 2013-08-04 NOTE — Progress Notes (Signed)
MFM ultrasound  Indication: 28 yr old G3P0020 at 109w2d with hepatitis B for fetal anatomic survey. Remote read.  Findings: 1. Single intraterine pregnancy. 2. Fetal biometry is consistent with dating. 3. Anterior placenta without evidence of previa. 4. Normal amniotic fluid volume. 5. Normal transabdominal cervical length. 6. There is a small right adnexal cyst. 7. The views of the cavum, nasal bone, spine, and aortic arch are limited. 8. The remainder of the limited anatomy survey is normal.  Recommendations: 1. Appropriate fetal growth. 2. Limited anatomy survey: - recommend follow up in 3-4 weeks to complete anatomic survey 3. Normal quad screen 4. Hepatitis B: - no consult requested  [place], MD

## 2013-08-04 NOTE — Assessment & Plan Note (Signed)
Will screen pt for Hepatitis B DNA to see if she falls into treatment range (> 200,000). Her ALT is > 2x upper limit of normal which would suggest she may need treatment. Would consider starting her on lamivudine or tenofovir as she gets closer to delivery (28-30 weeks) to prevent maternal-child transmission. Will see her back in ~ 3 weeks.

## 2013-08-05 LAB — HEPATITIS B SURFACE ANTIBODY,QUALITATIVE: Hep B S Ab: NEGATIVE

## 2013-08-05 LAB — HEPATITIS B CORE ANTIBODY, TOTAL: Hep B Core Total Ab: REACTIVE — AB

## 2013-08-05 LAB — HEPATITIS B SURFACE ANTIGEN: HEP B S AG: POSITIVE — AB

## 2013-08-07 ENCOUNTER — Encounter: Payer: Self-pay | Admitting: Family Medicine

## 2013-08-07 LAB — HEPATITIS B DNA, ULTRAQUANTITATIVE, PCR
Hepatitis B DNA (Calc): 792 copies/mL — ABNORMAL HIGH (ref <116)
Hepatitis B DNA: 136 IU/mL — ABNORMAL HIGH (ref <20)

## 2013-08-07 LAB — HEPATITIS B E ANTIGEN: Hepatitis Be Antigen: NEGATIVE

## 2013-08-11 ENCOUNTER — Encounter: Payer: Self-pay | Admitting: Advanced Practice Midwife

## 2013-08-11 ENCOUNTER — Ambulatory Visit (INDEPENDENT_AMBULATORY_CARE_PROVIDER_SITE_OTHER): Payer: Medicaid Other | Admitting: Advanced Practice Midwife

## 2013-08-11 VITALS — BP 121/76 | Temp 97.9°F | Wt 171.0 lb

## 2013-08-11 DIAGNOSIS — Z3480 Encounter for supervision of other normal pregnancy, unspecified trimester: Secondary | ICD-10-CM

## 2013-08-11 LAB — POCT URINALYSIS DIP (DEVICE)
Bilirubin Urine: NEGATIVE
GLUCOSE, UA: NEGATIVE mg/dL
Hgb urine dipstick: NEGATIVE
Ketones, ur: NEGATIVE mg/dL
Leukocytes, UA: NEGATIVE
NITRITE: NEGATIVE
Protein, ur: NEGATIVE mg/dL
Specific Gravity, Urine: 1.02 (ref 1.005–1.030)
UROBILINOGEN UA: 0.2 mg/dL (ref 0.0–1.0)
pH: 7 (ref 5.0–8.0)

## 2013-08-11 NOTE — Progress Notes (Signed)
Doing well.  Feeling fetal movement, denies vaginal bleeding, LOF, cramping/contractions.

## 2013-08-11 NOTE — Progress Notes (Signed)
P=87,

## 2013-08-24 ENCOUNTER — Ambulatory Visit (INDEPENDENT_AMBULATORY_CARE_PROVIDER_SITE_OTHER): Payer: Medicaid Other | Admitting: Infectious Diseases

## 2013-08-24 ENCOUNTER — Encounter: Payer: Self-pay | Admitting: Infectious Diseases

## 2013-08-24 VITALS — BP 117/67 | HR 90 | Temp 98.0°F | Ht 63.0 in | Wt 173.0 lb

## 2013-08-24 DIAGNOSIS — B1910 Unspecified viral hepatitis B without hepatic coma: Secondary | ICD-10-CM

## 2013-08-24 NOTE — Progress Notes (Signed)
   Subjective:    Patient ID: Andrea Serrano, female    DOB: [birth year], 28 y.o.   MRN: [id number]  HPI 28 yo F who is ~5 month pregnant (Estimated delivery date 12-24-13). Was dx with Hepatitis B 2012 when she immigrated here from [place].  She was eval at [facility] Feb 2015 for her Hep B. She had Hep B DNA <1000 and Hep B S Ag+ and Core Ab+. AST 41, ALT 91.  Has been feeling well. No changes in BM, urine or icterus.  Has been feeling fetal movements, has been gaining wt. No LE edema.   Review of Systems     Objective:   Physical Exam  Constitutional: She appears well-developed and well-nourished.  HENT:  Mouth/Throat: No oropharyngeal exudate.  Eyes: EOM are normal. No scleral icterus.  Cardiovascular: Normal rate, regular rhythm and normal heart sounds.   Pulmonary/Chest: Effort normal and breath sounds normal.  Abdominal: Soft. Bowel sounds are normal. She exhibits distension. There is no tenderness.  Musculoskeletal: She exhibits no edema.          Assessment & Plan:

## 2013-08-24 NOTE — Assessment & Plan Note (Signed)
She is eAg-, Core+, S Ag-. Her VL is < 1000. By algorhythms in uptodate, she does not qualify for therapy.  Will also discuss this with hepatology.  Her child will need Hep B Ig, Vaccine at birth.  Will see her back in 6 weeks with repeat labs.

## 2013-08-25 ENCOUNTER — Ambulatory Visit (HOSPITAL_COMMUNITY)
Admission: RE | Admit: 2013-08-25 | Discharge: 2013-08-25 | Disposition: A | Discharge status: Home / Self Care | Payer: Medicaid Other | Source: Ambulatory Visit | Attending: Advanced Practice Midwife | Admitting: Advanced Practice Midwife

## 2013-08-25 DIAGNOSIS — Z3480 Encounter for supervision of other normal pregnancy, unspecified trimester: Secondary | ICD-10-CM

## 2013-08-25 DIAGNOSIS — Z3689 Encounter for other specified antenatal screening: Secondary | ICD-10-CM | POA: Insufficient documentation

## 2013-09-08 ENCOUNTER — Encounter: Payer: Self-pay | Admitting: Obstetrics and Gynecology

## 2013-09-08 ENCOUNTER — Ambulatory Visit (INDEPENDENT_AMBULATORY_CARE_PROVIDER_SITE_OTHER): Payer: Medicaid Other | Admitting: Obstetrics and Gynecology

## 2013-09-08 VITALS — BP 120/72 | Temp 97.1°F | Wt 174.6 lb

## 2013-09-08 DIAGNOSIS — B1910 Unspecified viral hepatitis B without hepatic coma: Secondary | ICD-10-CM

## 2013-09-08 DIAGNOSIS — Z3480 Encounter for supervision of other normal pregnancy, unspecified trimester: Secondary | ICD-10-CM

## 2013-09-08 DIAGNOSIS — O98519 Other viral diseases complicating pregnancy, unspecified trimester: Secondary | ICD-10-CM

## 2013-09-08 LAB — POCT URINALYSIS DIP (DEVICE)
BILIRUBIN URINE: NEGATIVE
Glucose, UA: NEGATIVE mg/dL
Hgb urine dipstick: NEGATIVE
Ketones, ur: NEGATIVE mg/dL
LEUKOCYTES UA: NEGATIVE
NITRITE: NEGATIVE
PH: 7 (ref 5.0–8.0)
Protein, ur: NEGATIVE mg/dL
Specific Gravity, Urine: 1.02 (ref 1.005–1.030)
Urobilinogen, UA: 0.2 mg/dL (ref 0.0–1.0)

## 2013-09-08 NOTE — Progress Notes (Signed)
Has seen [facility] (dr. Johnnye Sima) re: Hep B. Has F/U scheduled for POC.  Scheduled F/U anatomic [place].  Feeling well. Discussed pregnancy discomforts, plans.

## 2013-09-08 NOTE — Patient Instructions (Signed)
Second Trimester of Pregnancy The second trimester is from week 13 through week 28, months 4 through 6. The second trimester is often a time when you feel your best. Your body has also adjusted to being pregnant, and you begin to feel better physically. Usually, morning sickness has lessened or quit completely, you may have more energy, and you may have an increase in appetite. The second trimester is also a time when the fetus is growing rapidly. At the end of the sixth month, the fetus is about 9 inches long and weighs about 1 pounds. You will likely begin to feel the baby move (quickening) between 18 and 20 weeks of the pregnancy. BODY CHANGES Your body goes through many changes during pregnancy. The changes vary from woman to woman.   Your weight will continue to increase. You will notice your lower abdomen bulging out.  You may begin to get stretch marks on your hips, abdomen, and breasts.  You may develop headaches that can be relieved by medicines approved by your caregiver.  You may urinate more often because the fetus is pressing on your bladder.  You may develop or continue to have heartburn as a result of your pregnancy.  You may develop constipation because certain hormones are causing the muscles that push waste through your intestines to slow down.  You may develop hemorrhoids or swollen, bulging veins (varicose veins).  You may have back pain because of the weight gain and pregnancy hormones relaxing your joints between the bones in your pelvis and as a result of a shift in weight and the muscles that support your balance.  Your breasts will continue to grow and be tender.  Your gums may bleed and may be sensitive to brushing and flossing.  Dark spots or blotches (chloasma, mask of pregnancy) may develop on your face. This will likely fade after the baby is born.  A dark line from your belly button to the pubic area (linea nigra) may appear. This will likely fade after the  baby is born. WHAT TO EXPECT AT YOUR PRENATAL VISITS During a routine prenatal visit:  You will be weighed to make sure you and the fetus are growing normally.  Your blood pressure will be taken.  Your abdomen will be measured to track your baby's growth.  The fetal heartbeat will be listened to.  Any test results from the previous visit will be discussed. Your caregiver may ask you:  How you are feeling.  If you are feeling the baby move.  If you have had any abnormal symptoms, such as leaking fluid, bleeding, severe headaches, or abdominal cramping.  If you have any questions. Other tests that may be performed during your second trimester include:  Blood tests that check for:  Low iron levels (anemia).  Gestational diabetes (between 24 and 28 weeks).  Rh antibodies.  Urine tests to check for infections, diabetes, or protein in the urine.  An ultrasound to confirm the proper growth and development of the baby.  An amniocentesis to check for possible genetic problems.  Fetal screens for spina bifida and Down syndrome. HOME CARE INSTRUCTIONS   Avoid all smoking, herbs, alcohol, and unprescribed drugs. These chemicals affect the formation and growth of the baby.  Follow your caregiver's instructions regarding medicine use. There are medicines that are either safe or unsafe to take during pregnancy.  Exercise only as directed by your caregiver. Experiencing uterine cramps is a good sign to stop exercising.  Continue to eat regular,   healthy meals.  Wear a good support bra for breast tenderness.  Do not use hot tubs, steam rooms, or saunas.  Wear your seat belt at all times when driving.  Avoid raw meat, uncooked cheese, cat litter boxes, and soil used by cats. These carry germs that can cause birth defects in the baby.  Take your prenatal vitamins.  Try taking a stool softener (if your caregiver approves) if you develop constipation. Eat more high-fiber foods,  such as fresh vegetables or fruit and whole grains. Drink plenty of fluids to keep your urine clear or pale yellow.  Take warm sitz baths to soothe any pain or discomfort caused by hemorrhoids. Use hemorrhoid cream if your caregiver approves.  If you develop varicose veins, wear support hose. Elevate your feet for 15 minutes, 3 4 times a day. Limit salt in your diet.  Avoid heavy lifting, wear low heel shoes, and practice good posture.  Rest with your legs elevated if you have leg cramps or low back pain.  Visit your dentist if you have not gone yet during your pregnancy. Use a soft toothbrush to brush your teeth and be gentle when you floss.  A sexual relationship may be continued unless your caregiver directs you otherwise.  Continue to go to all your prenatal visits as directed by your caregiver. SEEK MEDICAL CARE IF:   You have dizziness.  You have mild pelvic cramps, pelvic pressure, or nagging pain in the abdominal area.  You have persistent nausea, vomiting, or diarrhea.  You have a bad smelling vaginal discharge.  You have pain with urination. SEEK IMMEDIATE MEDICAL CARE IF:   You have a fever.  You are leaking fluid from your vagina.  You have spotting or bleeding from your vagina.  You have severe abdominal cramping or pain.  You have rapid weight gain or loss.  You have shortness of breath with chest pain.  You notice sudden or extreme swelling of your face, hands, ankles, feet, or legs.  You have not felt your baby move in over an hour.  You have severe headaches that do not go away with medicine.  You have vision changes. Document Released: 05/22/2001 Document Revised: 01/28/2013 Document Reviewed: 07/29/2012 [employer] Patient Information 2014 [employer].  

## 2013-09-08 NOTE — Progress Notes (Signed)
Pulse: 92

## 2013-09-29 ENCOUNTER — Ambulatory Visit (HOSPITAL_COMMUNITY)
Admission: RE | Admit: 2013-09-29 | Discharge: 2013-09-29 | Disposition: A | Discharge status: Home / Self Care | Payer: Medicaid Other | Source: Ambulatory Visit | Attending: Obstetrics and Gynecology | Admitting: Obstetrics and Gynecology

## 2013-09-29 DIAGNOSIS — Z3480 Encounter for supervision of other normal pregnancy, unspecified trimester: Secondary | ICD-10-CM | POA: Insufficient documentation

## 2013-10-06 ENCOUNTER — Encounter: Payer: Self-pay | Admitting: Advanced Practice Midwife

## 2013-10-06 ENCOUNTER — Ambulatory Visit (INDEPENDENT_AMBULATORY_CARE_PROVIDER_SITE_OTHER): Payer: Medicaid Other | Admitting: Advanced Practice Midwife

## 2013-10-06 VITALS — BP 125/84 | HR 104 | Temp 98.7°F | Wt 180.7 lb

## 2013-10-06 DIAGNOSIS — Z23 Encounter for immunization: Secondary | ICD-10-CM

## 2013-10-06 DIAGNOSIS — O98519 Other viral diseases complicating pregnancy, unspecified trimester: Secondary | ICD-10-CM

## 2013-10-06 DIAGNOSIS — B1910 Unspecified viral hepatitis B without hepatic coma: Secondary | ICD-10-CM

## 2013-10-06 DIAGNOSIS — Z3480 Encounter for supervision of other normal pregnancy, unspecified trimester: Secondary | ICD-10-CM

## 2013-10-06 LAB — POCT URINALYSIS DIP (DEVICE)
Bilirubin Urine: NEGATIVE
Glucose, UA: NEGATIVE mg/dL
HGB URINE DIPSTICK: NEGATIVE
Ketones, ur: NEGATIVE mg/dL
Nitrite: NEGATIVE
Protein, ur: NEGATIVE mg/dL
SPECIFIC GRAVITY, URINE: 1.015 (ref 1.005–1.030)
Urobilinogen, UA: 0.2 mg/dL (ref 0.0–1.0)
pH: 7 (ref 5.0–8.0)

## 2013-10-06 MED ORDER — TETANUS-DIPHTH-ACELL PERTUSSIS 5-2.5-18.5 LF-MCG/0.5 IM SUSP
0.5000 mL | Freq: Once | INTRAMUSCULAR | Status: DC
Start: 2013-10-06 — End: 2013-12-29

## 2013-10-06 NOTE — Progress Notes (Signed)
Dr Algis Downs recommendations reviewed. No treatment for now, he will follow. Pt has no complaints today. Glucola today

## 2013-10-06 NOTE — Patient Instructions (Signed)
Third Trimester of Pregnancy The third trimester is from week 29 through week 42, months 7 through 9. The third trimester is a time when the fetus is growing rapidly. At the end of the ninth month, the fetus is about 20 inches in length and weighs 6 10 pounds.  BODY CHANGES Your body goes through many changes during pregnancy. The changes vary from woman to woman.   Your weight will continue to increase. You can expect to gain 25 35 pounds (11 16 kg) by the end of the pregnancy.  You may begin to get stretch marks on your hips, abdomen, and breasts.  You may urinate more often because the fetus is moving lower into your pelvis and pressing on your bladder.  You may develop or continue to have heartburn as a result of your pregnancy.  You may develop constipation because certain hormones are causing the muscles that push waste through your intestines to slow down.  You may develop hemorrhoids or swollen, bulging veins (varicose veins).  You may have pelvic pain because of the weight gain and pregnancy hormones relaxing your joints between the bones in your pelvis. Back aches may result from over exertion of the muscles supporting your posture.  Your breasts will continue to grow and be tender. A yellow discharge may leak from your breasts called colostrum.  Your belly button may stick out.  You may feel short of breath because of your expanding uterus.  You may notice the fetus "dropping," or moving lower in your abdomen.  You may have a bloody mucus discharge. This usually occurs a few days to a week before labor begins.  Your cervix becomes thin and soft (effaced) near your due date. WHAT TO EXPECT AT YOUR PRENATAL EXAMS  You will have prenatal exams every 2 weeks until week 36. Then, you will have weekly prenatal exams. During a routine prenatal visit:  You will be weighed to make sure you and the fetus are growing normally.  Your blood pressure is taken.  Your abdomen will be  measured to track your baby's growth.  The fetal heartbeat will be listened to.  Any test results from the previous visit will be discussed.  You may have a cervical check near your due date to see if you have effaced. At around 36 weeks, your caregiver will check your cervix. At the same time, your caregiver will also perform a test on the secretions of the vaginal tissue. This test is to determine if a type of bacteria, Group B streptococcus, is present. Your caregiver will explain this further. Your caregiver may ask you:  What your birth plan is.  How you are feeling.  If you are feeling the baby move.  If you have had any abnormal symptoms, such as leaking fluid, bleeding, severe headaches, or abdominal cramping.  If you have any questions. Other tests or screenings that may be performed during your third trimester include:  Blood tests that check for low iron levels (anemia).  Fetal testing to check the health, activity level, and growth of the fetus. Testing is done if you have certain medical conditions or if there are problems during the pregnancy. FALSE LABOR You may feel small, irregular contractions that eventually go away. These are called Braxton Hicks contractions, or false labor. Contractions may last for hours, days, or even weeks before true labor sets in. If contractions come at regular intervals, intensify, or become painful, it is best to be seen by your caregiver.  SIGNS OF LABOR   Menstrual-like cramps.  Contractions that are 5 minutes apart or less.  Contractions that start on the top of the uterus and spread down to the lower abdomen and back.  A sense of increased pelvic pressure or back pain.  A watery or bloody mucus discharge that comes from the vagina. If you have any of these signs before the 37th week of pregnancy, call your caregiver right away. You need to go to the hospital to get checked immediately. HOME CARE INSTRUCTIONS   Avoid all  smoking, herbs, alcohol, and unprescribed drugs. These chemicals affect the formation and growth of the baby.  Follow your caregiver's instructions regarding medicine use. There are medicines that are either safe or unsafe to take during pregnancy.  Exercise only as directed by your caregiver. Experiencing uterine cramps is a good sign to stop exercising.  Continue to eat regular, healthy meals.  Wear a good support bra for breast tenderness.  Do not use hot tubs, steam rooms, or saunas.  Wear your seat belt at all times when driving.  Avoid raw meat, uncooked cheese, cat litter boxes, and soil used by cats. These carry germs that can cause birth defects in the baby.  Take your prenatal vitamins.  Try taking a stool softener (if your caregiver approves) if you develop constipation. Eat more high-fiber foods, such as fresh vegetables or fruit and whole grains. Drink plenty of fluids to keep your urine clear or pale yellow.  Take warm sitz baths to soothe any pain or discomfort caused by hemorrhoids. Use hemorrhoid cream if your caregiver approves.  If you develop varicose veins, wear support hose. Elevate your feet for 15 minutes, 3 4 times a day. Limit salt in your diet.  Avoid heavy lifting, wear low heal shoes, and practice good posture.  Rest a lot with your legs elevated if you have leg cramps or low back pain.  Visit your dentist if you have not gone during your pregnancy. Use a soft toothbrush to brush your teeth and be gentle when you floss.  A sexual relationship may be continued unless your caregiver directs you otherwise.  Do not travel far distances unless it is absolutely necessary and only with the approval of your caregiver.  Take prenatal classes to understand, practice, and ask questions about the labor and delivery.  Make a trial run to the hospital.  Pack your hospital bag.  Prepare the baby's nursery.  Continue to go to all your prenatal visits as directed  by your caregiver. SEEK MEDICAL CARE IF:  You are unsure if you are in labor or if your water has broken.  You have dizziness.  You have mild pelvic cramps, pelvic pressure, or nagging pain in your abdominal area.  You have persistent nausea, vomiting, or diarrhea.  You have a bad smelling vaginal discharge.  You have pain with urination. SEEK IMMEDIATE MEDICAL CARE IF:   You have a fever.  You are leaking fluid from your vagina.  You have spotting or bleeding from your vagina.  You have severe abdominal cramping or pain.  You have rapid weight loss or gain.  You have shortness of breath with chest pain.  You notice sudden or extreme swelling of your face, hands, ankles, feet, or legs.  You have not felt your baby move in over an hour.  You have severe headaches that do not go away with medicine.  You have vision changes. Document Released: 05/22/2001 Document Revised: 01/28/2013 Document Reviewed:   You have severe abdominal cramping or pain.   You have rapid weight loss or gain.   You have shortness of breath with chest pain.   You notice sudden or extreme swelling of your face, hands, ankles, feet, or legs.   You have not felt your baby move in over an hour.   You have severe headaches that do not go away with medicine.   You have vision changes.  Document Released: 05/22/2001 Document Revised: 01/28/2013 Document Reviewed: 07/29/2012  [employer] Patient Information 2014 [employer].

## 2013-10-07 ENCOUNTER — Encounter: Payer: Self-pay | Admitting: Advanced Practice Midwife

## 2013-10-07 ENCOUNTER — Telehealth: Payer: Self-pay

## 2013-10-07 DIAGNOSIS — D696 Thrombocytopenia, unspecified: Secondary | ICD-10-CM | POA: Insufficient documentation

## 2013-10-07 LAB — HIV ANTIBODY (ROUTINE TESTING W REFLEX): HIV: NONREACTIVE

## 2013-10-07 LAB — GLUCOSE TOLERANCE, 1 HOUR (50G) W/O FASTING: Glucose, 1 Hour GTT: 143 mg/dL — ABNORMAL HIGH (ref 70–140)

## 2013-10-07 LAB — CBC
HCT: 33.2 % — ABNORMAL LOW (ref 36.0–46.0)
Hemoglobin: 11.2 g/dL — ABNORMAL LOW (ref 12.0–15.0)
MCH: 26.2 pg (ref 26.0–34.0)
MCHC: 33.7 g/dL (ref 30.0–36.0)
MCV: 77.8 fL — ABNORMAL LOW (ref 78.0–100.0)
Platelets: 118 10*3/uL — ABNORMAL LOW (ref 150–400)
RBC: 4.27 MIL/uL (ref 3.87–5.11)
RDW: 14 % (ref 11.5–15.5)
WBC: 7 10*3/uL (ref 4.0–10.5)

## 2013-10-07 LAB — RPR

## 2013-10-07 NOTE — Telephone Encounter (Signed)
Called pt and informed pt of abnormal 1 hour and the need for the 3hr glucose testing.  I informed pt that she would need to be here for 3 hours the duration of testing and fasting from midnight.  Pt stated that she would be able to come in on Monday, May 4th @ 0800.

## 2013-10-07 NOTE — Telephone Encounter (Signed)
Message copied by Michel Harrow on Wed Oct 07, 2013 11:12 AM ------      Message from: Seabron Spates      Created: Wed Oct 07, 2013  8:18 AM      Regarding: Need 3 hr GTT       Glucola 143            Needs 3 hr GTT            Thanks      marie ------

## 2013-10-08 ENCOUNTER — Telehealth: Payer: Self-pay | Admitting: Oncology

## 2013-10-08 ENCOUNTER — Telehealth: Payer: Self-pay

## 2013-10-08 DIAGNOSIS — D696 Thrombocytopenia, unspecified: Secondary | ICD-10-CM

## 2013-10-08 NOTE — Telephone Encounter (Signed)
Spoke to [place] from [facility] who asked for ambulatory referral order to be placed and she would call pt. With appointment. Ambulatory order for hematology placed-- Tiffany to call pt. With appointment.

## 2013-10-08 NOTE — Telephone Encounter (Signed)
[place] at [place] [phone]) in an attempt to make referral with Dr. Murriel Hopper. Left message for Tiffany, the new pt. Scheduler, who does not get in to the office until 9 with pt. Information and reason for referral. Awaiting response.

## 2013-10-08 NOTE — Telephone Encounter (Signed)
C/D 10/08/13 for appt. 10/28/13

## 2013-10-08 NOTE — Telephone Encounter (Signed)
Message copied by Geanie Logan on Thu Oct 08, 2013  8:11 AM ------      Message from: Seabron Spates      Created: Wed Oct 07, 2013  4:21 PM       Has thrombocytopenia            Dr Johnnye Sima follows her hepatitis, but says we should have Hemtology evaluate her thrombo.            Can we get her a referral?            Thanks      Lelan Pons ------

## 2013-10-12 ENCOUNTER — Other Ambulatory Visit: Payer: Medicaid Other

## 2013-10-12 DIAGNOSIS — R739 Hyperglycemia, unspecified: Secondary | ICD-10-CM

## 2013-10-13 ENCOUNTER — Encounter: Payer: Self-pay | Admitting: Obstetrics & Gynecology

## 2013-10-13 LAB — GLUCOSE TOLERANCE, 3 HOURS
GLUCOSE 3 HOUR GTT: 93 mg/dL (ref 70–144)
GLUCOSE, 1 HOUR-GESTATIONAL: 119 mg/dL (ref 70–189)
GLUCOSE, FASTING-GESTATIONAL: 78 mg/dL (ref 70–104)
Glucose Tolerance, 2 hour: 114 mg/dL (ref 70–164)

## 2013-10-20 ENCOUNTER — Ambulatory Visit (INDEPENDENT_AMBULATORY_CARE_PROVIDER_SITE_OTHER): Payer: Medicaid Other | Admitting: Obstetrics and Gynecology

## 2013-10-20 ENCOUNTER — Encounter: Payer: Self-pay | Admitting: Obstetrics and Gynecology

## 2013-10-20 VITALS — BP 123/76 | HR 91 | Temp 97.6°F | Wt 182.3 lb

## 2013-10-20 DIAGNOSIS — D696 Thrombocytopenia, unspecified: Secondary | ICD-10-CM

## 2013-10-20 DIAGNOSIS — B1910 Unspecified viral hepatitis B without hepatic coma: Secondary | ICD-10-CM

## 2013-10-20 DIAGNOSIS — O98519 Other viral diseases complicating pregnancy, unspecified trimester: Secondary | ICD-10-CM

## 2013-10-20 LAB — POCT URINALYSIS DIP (DEVICE)
Bilirubin Urine: NEGATIVE
GLUCOSE, UA: 100 mg/dL — AB
Hgb urine dipstick: NEGATIVE
Ketones, ur: NEGATIVE mg/dL
Nitrite: NEGATIVE
Protein, ur: NEGATIVE mg/dL
Specific Gravity, Urine: 1.025 (ref 1.005–1.030)
UROBILINOGEN UA: 1 mg/dL (ref 0.0–1.0)
pH: 6.5 (ref 5.0–8.0)

## 2013-10-20 NOTE — Progress Notes (Signed)
Thrombocytopenia with plts stable, last 118k. Dr. Johnnye Sima aware and following.  Doing well. Good FM. 3 hr OGTT normal. Plans breastfeeding; undecided contraception.

## 2013-10-20 NOTE — Patient Instructions (Signed)
Contraception Choices Contraception (birth control) is the use of any methods or devices to prevent pregnancy. Below are some methods to help avoid pregnancy. HORMONAL METHODS   Contraceptive implant This is a thin, plastic tube containing progesterone hormone. It does not contain estrogen hormone. Your health care provider inserts the tube in the inner part of the upper arm. The tube can remain in place for up to 3 years. After 3 years, the implant must be removed. The implant prevents the ovaries from releasing an egg (ovulation), thickens the cervical mucus to prevent sperm from entering the uterus, and thins the lining of the inside of the uterus.  Progesterone-only injections These injections are given every 3 months by your health care provider to prevent pregnancy. This synthetic progesterone hormone stops the ovaries from releasing eggs. It also thickens cervical mucus and changes the uterine lining. This makes it harder for sperm to survive in the uterus.  Birth control pills These pills contain estrogen and progesterone hormone. They work by preventing the ovaries from releasing eggs (ovulation). They also cause the cervical mucus to thicken, preventing the sperm from entering the uterus. Birth control pills are prescribed by a health care provider.Birth control pills can also be used to treat heavy periods.  Minipill This type of birth control pill contains only the progesterone hormone. They are taken every day of each month and must be prescribed by your health care provider.  Birth control patch The patch contains hormones similar to those in birth control pills. It must be changed once a week and is prescribed by a health care provider.  Vaginal ring The ring contains hormones similar to those in birth control pills. It is left in the vagina for 3 weeks, removed for 1 week, and then a new one is put back in place. The patient must be comfortable inserting and removing the ring from the  vagina.A health care provider's prescription is necessary.  Emergency contraception Emergency contraceptives prevent pregnancy after unprotected sexual intercourse. This pill can be taken right after sex or up to 5 days after unprotected sex. It is most effective the sooner you take the pills after having sexual intercourse. Most emergency contraceptive pills are available without a prescription. Check with your pharmacist. Do not use emergency contraception as your only form of birth control. BARRIER METHODS   Female condom This is a thin sheath (latex or rubber) that is worn over the penis during sexual intercourse. It can be used with spermicide to increase effectiveness.  Female condom. This is a soft, loose-fitting sheath that is put into the vagina before sexual intercourse.  Diaphragm This is a soft, latex, dome-shaped barrier that must be fitted by a health care provider. It is inserted into the vagina, along with a spermicidal jelly. It is inserted before intercourse. The diaphragm should be left in the vagina for 6 to 8 hours after intercourse.  Cervical cap This is a round, soft, latex or plastic cup that fits over the cervix and must be fitted by a health care provider. The cap can be left in place for up to 48 hours after intercourse.  Sponge This is a soft, circular piece of polyurethane foam. The sponge has spermicide in it. It is inserted into the vagina after wetting it and before sexual intercourse.  Spermicides These are chemicals that kill or block sperm from entering the cervix and uterus. They come in the form of creams, jellies, suppositories, foam, or tablets. They do not require a   prescription. They are inserted into the vagina with an applicator before having sexual intercourse. The process must be repeated every time you have sexual intercourse. INTRAUTERINE CONTRACEPTION  Intrauterine device (IUD) This is a T-shaped device that is put in a woman's uterus during a  menstrual period to prevent pregnancy. There are 2 types:  Copper IUD This type of IUD is wrapped in copper wire and is placed inside the uterus. Copper makes the uterus and fallopian tubes produce a fluid that kills sperm. It can stay in place for 10 years.  Hormone IUD This type of IUD contains the hormone progestin (synthetic progesterone). The hormone thickens the cervical mucus and prevents sperm from entering the uterus, and it also thins the uterine lining to prevent implantation of a fertilized egg. The hormone can weaken or kill the sperm that get into the uterus. It can stay in place for 3 5 years, depending on which type of IUD is used. PERMANENT METHODS OF CONTRACEPTION  Female tubal ligation This is when the woman's fallopian tubes are surgically sealed, tied, or blocked to prevent the egg from traveling to the uterus.  Hysteroscopic sterilization This involves placing a small coil or insert into each fallopian tube. Your doctor uses a technique called hysteroscopy to do the procedure. The device causes scar tissue to form. This results in permanent blockage of the fallopian tubes, so the sperm cannot fertilize the egg. It takes about 3 months after the procedure for the tubes to become blocked. You must use another form of birth control for these 3 months.  Female sterilization This is when the female has the tubes that carry sperm tied off (vasectomy).This blocks sperm from entering the vagina during sexual intercourse. After the procedure, the man can still ejaculate fluid (semen). NATURAL PLANNING METHODS  Natural family planning This is not having sexual intercourse or using a barrier method (condom, diaphragm, cervical cap) on days the woman could become pregnant.  Calendar method This is keeping track of the length of each menstrual cycle and identifying when you are fertile.  Ovulation method This is avoiding sexual intercourse during ovulation.  Symptothermal method This is  avoiding sexual intercourse during ovulation, using a thermometer and ovulation symptoms.  Post ovulation method This is timing sexual intercourse after you have ovulated. Regardless of which type or method of contraception you choose, it is important that you use condoms to protect against the transmission of sexually transmitted infections (STIs). Talk with your health care provider about which form of contraception is most appropriate for you. Document Released: 05/28/2005 Document Revised: 01/28/2013 Document Reviewed: 11/20/2012 Exit[employer] Patient Information 2014 [employer].  

## 2013-10-21 ENCOUNTER — Ambulatory Visit (INDEPENDENT_AMBULATORY_CARE_PROVIDER_SITE_OTHER): Payer: Medicaid Other | Admitting: Infectious Diseases

## 2013-10-21 ENCOUNTER — Encounter: Payer: Self-pay | Admitting: Infectious Diseases

## 2013-10-21 VITALS — BP 114/73 | HR 84 | Temp 98.1°F | Ht 63.0 in | Wt 182.0 lb

## 2013-10-21 DIAGNOSIS — D696 Thrombocytopenia, unspecified: Secondary | ICD-10-CM

## 2013-10-21 DIAGNOSIS — B1910 Unspecified viral hepatitis B without hepatic coma: Secondary | ICD-10-CM

## 2013-10-21 NOTE — Assessment & Plan Note (Signed)
OB to have heme eval. Not clear that this is due to Hep B as her LFTs would not indicate this degree of cirrhosis.

## 2013-10-21 NOTE — Assessment & Plan Note (Signed)
Will repeat her Hep B serologies and VL today.  I reminded her that baby will need Hep B Ig and vaccine series at birth. She does not have a pediatrician yet to forward this to.  Will call her if any of her indications for treatment change, otherwise plan to see her post-partum.

## 2013-10-21 NOTE — Progress Notes (Signed)
   Subjective:    Patient ID: Andrea Serrano, female    DOB: November 22, 1985, 28 y.o.   MRN: [id number]  HPI 28 yo F who is ~7 month pregnant (Estimated delivery date 12-24-13). Was dx with Hepatitis B 2012 when she immigrated here from [place].  She was eval at [facility] Feb 2015 for her Hep B. She had Hep B DNA <1000 and Hep B S Ag+ and Core Ab+. AST 41, ALT 91.   Has been feeling well. No changes in BM, urine or icterus.  Has been feeling fetal movements, has been gaining wt. Occas LE edema.     Review of Systems     Objective:   Physical Exam  Constitutional: She appears well-developed and well-nourished.  Appropriately gravid  HENT:  Mouth/Throat: No oropharyngeal exudate.  Eyes: EOM are normal. Pupils are equal, round, and reactive to light. No scleral icterus.  Neck: Neck supple.  Cardiovascular: Normal rate, regular rhythm and normal heart sounds.   Pulmonary/Chest: Effort normal and breath sounds normal.  Abdominal: Soft. Bowel sounds are normal. She exhibits distension. There is no tenderness.  Lymphadenopathy:    She has no cervical adenopathy.          Assessment & Plan:

## 2013-10-22 LAB — HEPATITIS B CORE ANTIBODY, TOTAL: HEP B C TOTAL AB: REACTIVE — AB

## 2013-10-22 LAB — HEPATITIS B SURFACE ANTIGEN: HEP B S AG: POSITIVE — AB

## 2013-10-22 LAB — HEPATITIS B SURFACE ANTIBODY,QUALITATIVE: Hep B S Ab: NEGATIVE

## 2013-10-23 ENCOUNTER — Other Ambulatory Visit: Payer: Self-pay | Admitting: Oncology

## 2013-10-23 DIAGNOSIS — D696 Thrombocytopenia, unspecified: Secondary | ICD-10-CM

## 2013-10-23 LAB — HEPATITIS B E ANTIGEN: Hepatitis Be Antigen: NONREACTIVE

## 2013-10-26 LAB — HEPATITIS B DNA, ULTRAQUANTITATIVE, PCR
HEPATITIS B DNA: 60 [IU]/mL — AB (ref <20)
Hepatitis B DNA (Calc): 349 copies/mL — ABNORMAL HIGH (ref <116)

## 2013-10-28 ENCOUNTER — Ambulatory Visit: Payer: Medicaid Other

## 2013-10-28 ENCOUNTER — Ambulatory Visit: Payer: Medicaid Other | Admitting: Oncology

## 2013-10-28 ENCOUNTER — Telehealth: Payer: Self-pay | Admitting: Medical Oncology

## 2013-10-28 ENCOUNTER — Other Ambulatory Visit: Payer: Medicaid Other

## 2013-10-28 NOTE — Telephone Encounter (Signed)
Call to patient to confirm tomorrow's appt @ 1:30, patient confirms and will bring current list of medications. Also informed of free valet parking. Pt denies questions.

## 2013-10-29 ENCOUNTER — Ambulatory Visit (HOSPITAL_BASED_OUTPATIENT_CLINIC_OR_DEPARTMENT_OTHER): Payer: Medicaid Other | Admitting: Oncology

## 2013-10-29 ENCOUNTER — Ambulatory Visit: Payer: Medicaid Other

## 2013-10-29 ENCOUNTER — Encounter: Payer: Self-pay | Admitting: Oncology

## 2013-10-29 ENCOUNTER — Other Ambulatory Visit (HOSPITAL_BASED_OUTPATIENT_CLINIC_OR_DEPARTMENT_OTHER): Payer: Medicaid Other

## 2013-10-29 ENCOUNTER — Telehealth: Payer: Self-pay | Admitting: Oncology

## 2013-10-29 DIAGNOSIS — O9989 Other specified diseases and conditions complicating pregnancy, childbirth and the puerperium: Secondary | ICD-10-CM

## 2013-10-29 DIAGNOSIS — B1910 Unspecified viral hepatitis B without hepatic coma: Secondary | ICD-10-CM

## 2013-10-29 DIAGNOSIS — O99891 Other specified diseases and conditions complicating pregnancy: Secondary | ICD-10-CM

## 2013-10-29 DIAGNOSIS — D649 Anemia, unspecified: Secondary | ICD-10-CM

## 2013-10-29 DIAGNOSIS — D696 Thrombocytopenia, unspecified: Secondary | ICD-10-CM

## 2013-10-29 LAB — COMPREHENSIVE METABOLIC PANEL (CC13)
ALBUMIN: 3 g/dL — AB (ref 3.5–5.0)
ALK PHOS: 102 U/L (ref 40–150)
ALT: 21 U/L (ref 0–55)
AST: 19 U/L (ref 5–34)
Anion Gap: 9 mEq/L (ref 3–11)
BUN: 6.3 mg/dL — AB (ref 7.0–26.0)
CALCIUM: 9.3 mg/dL (ref 8.4–10.4)
CO2: 20 mEq/L — ABNORMAL LOW (ref 22–29)
CREATININE: 0.6 mg/dL (ref 0.6–1.1)
Chloride: 109 mEq/L (ref 98–109)
GLUCOSE: 89 mg/dL (ref 70–140)
POTASSIUM: 4.1 meq/L (ref 3.5–5.1)
Sodium: 138 mEq/L (ref 136–145)
Total Bilirubin: 0.34 mg/dL (ref 0.20–1.20)
Total Protein: 6.7 g/dL (ref 6.4–8.3)

## 2013-10-29 LAB — CBC WITH DIFFERENTIAL/PLATELET
BASO%: 0.1 % (ref 0.0–2.0)
BASOS ABS: 0 10*3/uL (ref 0.0–0.1)
EOS%: 0.8 % (ref 0.0–7.0)
Eosinophils Absolute: 0.1 10*3/uL (ref 0.0–0.5)
HCT: 33.8 % — ABNORMAL LOW (ref 34.8–46.6)
HEMOGLOBIN: 11.4 g/dL — AB (ref 11.6–15.9)
LYMPH#: 2.1 10*3/uL (ref 0.9–3.3)
LYMPH%: 26.4 % (ref 14.0–49.7)
MCH: 26.8 pg (ref 25.1–34.0)
MCHC: 33.7 g/dL (ref 31.5–36.0)
MCV: 79.5 fL (ref 79.5–101.0)
MONO#: 0.6 10*3/uL (ref 0.1–0.9)
MONO%: 7.5 % (ref 0.0–14.0)
NEUT#: 5.1 10*3/uL (ref 1.5–6.5)
NEUT%: 65.2 % (ref 38.4–76.8)
Platelets: 102 10*3/uL — ABNORMAL LOW (ref 145–400)
RBC: 4.25 10*6/uL (ref 3.70–5.45)
RDW: 13.2 % (ref 11.2–14.5)
WBC: 7.8 10*3/uL (ref 3.9–10.3)
nRBC: 0 % (ref 0–0)

## 2013-10-29 LAB — CHCC SMEAR

## 2013-10-29 NOTE — Telephone Encounter (Signed)
gv adn printed aptp sched and avs for pt fro [facility]

## 2013-10-29 NOTE — Progress Notes (Signed)
Checked in new pt with no financial concerns. °

## 2013-10-29 NOTE — Consult Note (Signed)
Reason for Referral: Thrombocytopenia.   HPI: 28 year old woman native of the [place] but have relocated to this area for the last 2-3 years. She is currently close to 7 months pregnant with her due date around mid July of 2015. That is her third pregnancy and have had to abortions in the past. She was then noted to have thrombocytopenia throughout her months of pregnancy with a platelet count ranged between 100-120,000. She was also noted to have hepatitis B positive and have been evaluated by Dr. Johnnye Sima of infectious disease. She does have increase in her transaminases with AST of 41 and ALT of 91. Bilirubin is normal at 0.3. She was referred to me for the evaluation of thrombocytopenia. Clinically, she is asymptomatic. She is doing well with her pregnancy without any known complications. She does report some fatigue and constipation but otherwise feels well. She has not reported any hematochezia or melena. Did not report any thrombosis or bleeding. She did not have any altered mental status or dysuria. She did not have any lower extremity edema. She continues to perform activities of daily living without any hindrance or decline. She works part-time in a [employer] without any problems   Past Medical History  Diagnosis Date  . Hepatitis B   . Constipation   :  Past Surgical History  Procedure Laterality Date  . Tooth extraction    :   Current Outpatient Prescriptions  Medication Sig Dispense Refill  . Prenatal Multivit-Min-Fe-FA (PRENATAL VITAMINS) 0.8 MG tablet Take 1 tablet by mouth daily.  30 tablet  12   Current Facility-Administered Medications  Medication Dose Route Frequency Provider Last Rate Last Dose  . Tdap (BOOSTRIX) injection 0.5 mL  0.5 mL Intramuscular Once Seabron Spates, CNM           No Known Allergies:  Family History  Problem Relation Age of Onset  . Hypertension Mother   . Hypertension Father   . Heart disease Father   :  History   Social History   . Marital Status: Married    Spouse Name: N/A    Number of Children: N/A  . Years of Education: N/A   Occupational History  . Not on file.   Social History Main Topics  . Smoking status: Never Smoker   . Smokeless tobacco: Never Used  . Alcohol Use: No  . Drug Use: No  . Sexual Activity: Yes    Partners: Male    Birth Control/ Protection: None   Other Topics Concern  . Not on file   Social History Narrative  . No narrative on file  :  Constitutional: negative for chills, fevers and weight loss Respiratory: negative for cough, dyspnea on exertion and wheezing Cardiovascular: negative for chest pain, orthopnea and palpitations Gastrointestinal: negative for abdominal pain, melena and vomiting Genitourinary:negative for frequency, hematuria and hesitancy Integument/breast: negative for pruritus and rash Hematologic/lymphatic: negative for bleeding, easy bruising, lymphadenopathy and petechiae Musculoskeletal:negative for arthralgias and stiff joints Neurological: negative for coordination problems, dizziness and seizures Behavioral/Psych: negative for anxiety and depression Endocrine: negative for temperature intolerance Allergic/Immunologic: negative for urticaria  Exam: ECOG 0 Last menstrual period 03/22/2013, unknown if currently breastfeeding. General appearance: alert, cooperative and appears stated age Head: Normocephalic, without obvious abnormality Throat: lips, mucosa, and tongue normal; teeth and gums normal Neck: no adenopathy and supple, symmetrical, trachea midline Back: symmetric, no curvature. ROM normal. No CVA tenderness. Resp: clear to auscultation bilaterally Cardio: regular rate and rhythm, S1, S2 normal, no murmur,  click, rub or gallop GI: Abdomen is soft, non-tender; bowel sounds normal; no masses,  no organomegaly. Gravid without any masses. Extremities: extremities normal, atraumatic, no cyanosis or edema Pulses: 2+ and symmetric Skin: Skin  color, texture, turgor normal. No rashes or lesions Lymph nodes: Cervical, supraclavicular, and axillary nodes normal. Neurologic: Grossly normal.   Recent Labs  10/29/13 1327  WBC 7.8  HGB 11.4*  HCT 33.8*  PLT 102*      Blood smear review: Peripheral smear personally reviewed today and the smear appears normal. Blood cells did not show any evidence of fragments or schistocytes. Platelets are decreased in number but enlarged in size. No evidence of any dysplasia or immature white cells.    Assessment and Plan:   28 year old African woman with the following issues:  1. Thrombocytopenia: The differential diagnosis was discussed with the patient today. The most likely etiology would be related to gestational thrombocytopenia and possibly ITP. I see no evidence to suggest TTP or HUS and I certainly do not see any evidence to suggest ecclampsia. She has normal liver function tests today as well as bilirubin without any evidence of hemolysis or red cell fragmentation.  For management standpoint, I see no intervention is needed at this time. Her platelet counts were likely recover after delivery. I do not anticipate complications associated with this platelet count and should be adequate to have normal delivery. I discussed with her the possibility of needing an epidural which could be an issue if her platelet counts below 90,000. She have expressed desire not to have an epidural at this time. I see no role for steroids or platelet transfusion at this time. I will set her up a followup post partum to recheck her platelet count.  2. Hepatitis B: I don't think it's contributing to her thrombocytopenia at this time. She follows with infectious disease.  3. Anemia: Very mild and related to her pregnancy state. She is currently on prenatal vitamins.

## 2013-10-29 NOTE — Progress Notes (Signed)
Please see consult note.  

## 2013-10-30 ENCOUNTER — Encounter: Payer: Self-pay | Admitting: *Deleted

## 2013-10-30 DIAGNOSIS — O98519 Other viral diseases complicating pregnancy, unspecified trimester: Secondary | ICD-10-CM | POA: Insufficient documentation

## 2013-11-04 ENCOUNTER — Ambulatory Visit (INDEPENDENT_AMBULATORY_CARE_PROVIDER_SITE_OTHER): Payer: Medicaid Other | Admitting: Family Medicine

## 2013-11-04 VITALS — BP 110/72 | HR 85 | Temp 97.0°F | Wt 182.1 lb

## 2013-11-04 DIAGNOSIS — B1910 Unspecified viral hepatitis B without hepatic coma: Secondary | ICD-10-CM

## 2013-11-04 DIAGNOSIS — Z3480 Encounter for supervision of other normal pregnancy, unspecified trimester: Secondary | ICD-10-CM

## 2013-11-04 LAB — POCT URINALYSIS DIP (DEVICE)
Bilirubin Urine: NEGATIVE
GLUCOSE, UA: NEGATIVE mg/dL
Ketones, ur: NEGATIVE mg/dL
NITRITE: NEGATIVE
Protein, ur: 30 mg/dL — AB
Specific Gravity, Urine: 1.025 (ref 1.005–1.030)
Urobilinogen, UA: 0.2 mg/dL (ref 0.0–1.0)
pH: 6 (ref 5.0–8.0)

## 2013-11-04 NOTE — Progress Notes (Signed)
+  FM, no lof, no vb, no ctx No complaints.   reivewed ID note - needs PP vaccine and IG  Andrea Serrano is a 28 y.o. G3P0020 at [redacted]w[redacted]d here for [place] visit.  Discussed with Patient:  -Plans to breast feed.  All questions answered. -Continue prenatal vitamins. -Reviewed fetal kick counts Pt to perform daily at a time when the baby is active, lie laterally with both hands on belly in quiet room and count all movements (hiccups, shoulder rolls, obvious kicks, etc); pt is to report to clinic L&D for less than 10 movements felt in a one hour time period-pt told as soon as she counts 10 movements the count is complete.  - Routine precautions discussed (depression, infection s/s).   Patient provided with all pertinent phone numbers for emergencies. - RTC for any VB, regular, painful cramps/ctxs occurring at a rate of >2/10 min, fever (100.5 or higher), n/v/d, any pain that is unresolving or worsening, LOF, decreased fetal movement, CP, SOB, edema - RTC in 2 weeks for next appt.  Problems: Patient Active Problem List   Diagnosis Date Noted  . Other maternal viral disease, antepartum 10/30/2013  . Thrombocytopenia 10/07/2013  . Hepatitis B infection 08/04/2013  . Supervision of normal subsequent pregnancy 07/14/2013    To Do:  [ ]  BCM: dec [ ]  Readiness: baby has a place to sleep, car seat, other baby necessities.  Edu: [ x] PTL precautions; [ ]  BF class; [ ]  childbirth class; [ ]   BF counseling;

## 2013-11-04 NOTE — Patient Instructions (Signed)
Third Trimester of Pregnancy The third trimester is from week 29 through week 42, months 7 through 9. The third trimester is a time when the fetus is growing rapidly. At the end of the ninth month, the fetus is about 20 inches in length and weighs 6 10 pounds.  BODY CHANGES Your body goes through many changes during pregnancy. The changes vary from woman to woman.   Your weight will continue to increase. You can expect to gain 25 35 pounds (11 16 kg) by the end of the pregnancy.  You may begin to get stretch marks on your hips, abdomen, and breasts.  You may urinate more often because the fetus is moving lower into your pelvis and pressing on your bladder.  You may develop or continue to have heartburn as a result of your pregnancy.  You may develop constipation because certain hormones are causing the muscles that push waste through your intestines to slow down.  You may develop hemorrhoids or swollen, bulging veins (varicose veins).  You may have pelvic pain because of the weight gain and pregnancy hormones relaxing your joints between the bones in your pelvis. Back aches may result from over exertion of the muscles supporting your posture.  Your breasts will continue to grow and be tender. A yellow discharge may leak from your breasts called colostrum.  Your belly button may stick out.  You may feel short of breath because of your expanding uterus.  You may notice the fetus "dropping," or moving lower in your abdomen.  You may have a bloody mucus discharge. This usually occurs a few days to a week before labor begins.  Your cervix becomes thin and soft (effaced) near your due date. WHAT TO EXPECT AT YOUR PRENATAL EXAMS  You will have prenatal exams every 2 weeks until week 36. Then, you will have weekly prenatal exams. During a routine prenatal visit:  You will be weighed to make sure you and the fetus are growing normally.  Your blood pressure is taken.  Your abdomen will be  measured to track your baby's growth.  The fetal heartbeat will be listened to.  Any test results from the previous visit will be discussed.  You may have a cervical check near your due date to see if you have effaced. At around 36 weeks, your caregiver will check your cervix. At the same time, your caregiver will also perform a test on the secretions of the vaginal tissue. This test is to determine if a type of bacteria, Group B streptococcus, is present. Your caregiver will explain this further. Your caregiver may ask you:  What your birth plan is.  How you are feeling.  If you are feeling the baby move.  If you have had any abnormal symptoms, such as leaking fluid, bleeding, severe headaches, or abdominal cramping.  If you have any questions. Other tests or screenings that may be performed during your third trimester include:  Blood tests that check for low iron levels (anemia).  Fetal testing to check the health, activity level, and growth of the fetus. Testing is done if you have certain medical conditions or if there are problems during the pregnancy. FALSE LABOR You may feel small, irregular contractions that eventually go away. These are called Braxton Hicks contractions, or false labor. Contractions may last for hours, days, or even weeks before true labor sets in. If contractions come at regular intervals, intensify, or become painful, it is best to be seen by your caregiver.  SIGNS OF LABOR   Menstrual-like cramps.  Contractions that are 5 minutes apart or less.  Contractions that start on the top of the uterus and spread down to the lower abdomen and back.  A sense of increased pelvic pressure or back pain.  A watery or bloody mucus discharge that comes from the vagina. If you have any of these signs before the 37th week of pregnancy, call your caregiver right away. You need to go to the hospital to get checked immediately. HOME CARE INSTRUCTIONS   Avoid all  smoking, herbs, alcohol, and unprescribed drugs. These chemicals affect the formation and growth of the baby.  Follow your caregiver's instructions regarding medicine use. There are medicines that are either safe or unsafe to take during pregnancy.  Exercise only as directed by your caregiver. Experiencing uterine cramps is a good sign to stop exercising.  Continue to eat regular, healthy meals.  Wear a good support bra for breast tenderness.  Do not use hot tubs, steam rooms, or saunas.  Wear your seat belt at all times when driving.  Avoid raw meat, uncooked cheese, cat litter boxes, and soil used by cats. These carry germs that can cause birth defects in the baby.  Take your prenatal vitamins.  Try taking a stool softener (if your caregiver approves) if you develop constipation. Eat more high-fiber foods, such as fresh vegetables or fruit and whole grains. Drink plenty of fluids to keep your urine clear or pale yellow.  Take warm sitz baths to soothe any pain or discomfort caused by hemorrhoids. Use hemorrhoid cream if your caregiver approves.  If you develop varicose veins, wear support hose. Elevate your feet for 15 minutes, 3 4 times a day. Limit salt in your diet.  Avoid heavy lifting, wear low heal shoes, and practice good posture.  Rest a lot with your legs elevated if you have leg cramps or low back pain.  Visit your dentist if you have not gone during your pregnancy. Use a soft toothbrush to brush your teeth and be gentle when you floss.  A sexual relationship may be continued unless your caregiver directs you otherwise.  Do not travel far distances unless it is absolutely necessary and only with the approval of your caregiver.  Take prenatal classes to understand, practice, and ask questions about the labor and delivery.  Make a trial run to the hospital.  Pack your hospital bag.  Prepare the baby's nursery.  Continue to go to all your prenatal visits as directed  by your caregiver. SEEK MEDICAL CARE IF:  You are unsure if you are in labor or if your water has broken.  You have dizziness.  You have mild pelvic cramps, pelvic pressure, or nagging pain in your abdominal area.  You have persistent nausea, vomiting, or diarrhea.  You have a bad smelling vaginal discharge.  You have pain with urination. SEEK IMMEDIATE MEDICAL CARE IF:   You have a fever.  You are leaking fluid from your vagina.  You have spotting or bleeding from your vagina.  You have severe abdominal cramping or pain.  You have rapid weight loss or gain.  You have shortness of breath with chest pain.  You notice sudden or extreme swelling of your face, hands, ankles, feet, or legs.  You have not felt your baby move in over an hour.  You have severe headaches that do not go away with medicine.  You have vision changes. Document Released: 05/22/2001 Document Revised: 01/28/2013 Document Reviewed:   You have severe abdominal cramping or pain.   You have rapid weight loss or gain.   You have shortness of breath with chest pain.   You notice sudden or extreme swelling of your face, hands, ankles, feet, or legs.   You have not felt your baby move in over an hour.   You have severe headaches that do not go away with medicine.   You have vision changes.  Document Released: 05/22/2001 Document Revised: 01/28/2013 Document Reviewed: 07/29/2012  [employer] Patient Information 2014 [employer].

## 2013-11-20 ENCOUNTER — Ambulatory Visit (INDEPENDENT_AMBULATORY_CARE_PROVIDER_SITE_OTHER): Payer: Medicaid Other | Admitting: Advanced Practice Midwife

## 2013-11-20 VITALS — BP 108/72 | HR 84 | Temp 97.6°F | Wt 182.9 lb

## 2013-11-20 DIAGNOSIS — Z3480 Encounter for supervision of other normal pregnancy, unspecified trimester: Secondary | ICD-10-CM

## 2013-11-20 LAB — POCT URINALYSIS DIP (DEVICE)
BILIRUBIN URINE: NEGATIVE
GLUCOSE, UA: NEGATIVE mg/dL
Hgb urine dipstick: NEGATIVE
Ketones, ur: NEGATIVE mg/dL
Nitrite: NEGATIVE
Protein, ur: NEGATIVE mg/dL
Specific Gravity, Urine: 1.015 (ref 1.005–1.030)
Urobilinogen, UA: 0.2 mg/dL (ref 0.0–1.0)
pH: 7 (ref 5.0–8.0)

## 2013-11-20 NOTE — Progress Notes (Signed)
Doing well.  Good fetal movement, denies vaginal bleeding, LOF, regular contractions.  

## 2013-12-09 ENCOUNTER — Ambulatory Visit (INDEPENDENT_AMBULATORY_CARE_PROVIDER_SITE_OTHER): Payer: Medicaid Other | Admitting: Obstetrics and Gynecology

## 2013-12-09 VITALS — BP 125/63 | HR 77 | Wt 191.0 lb

## 2013-12-09 DIAGNOSIS — Z113 Encounter for screening for infections with a predominantly sexual mode of transmission: Secondary | ICD-10-CM

## 2013-12-09 DIAGNOSIS — R8271 Bacteriuria: Principal | ICD-10-CM

## 2013-12-09 DIAGNOSIS — O99891 Other specified diseases and conditions complicating pregnancy: Secondary | ICD-10-CM

## 2013-12-09 DIAGNOSIS — O9989 Other specified diseases and conditions complicating pregnancy, childbirth and the puerperium: Principal | ICD-10-CM

## 2013-12-09 DIAGNOSIS — Z3483 Encounter for supervision of other normal pregnancy, third trimester: Secondary | ICD-10-CM

## 2013-12-09 DIAGNOSIS — Z3685 Encounter for antenatal screening for Streptococcus B: Secondary | ICD-10-CM

## 2013-12-09 DIAGNOSIS — Z3480 Encounter for supervision of other normal pregnancy, unspecified trimester: Secondary | ICD-10-CM

## 2013-12-09 LAB — POCT URINALYSIS DIP (DEVICE)
BILIRUBIN URINE: NEGATIVE
GLUCOSE, UA: NEGATIVE mg/dL
KETONES UR: NEGATIVE mg/dL
Nitrite: NEGATIVE
Protein, ur: NEGATIVE mg/dL
Specific Gravity, Urine: 1.015 (ref 1.005–1.030)
Urobilinogen, UA: 0.2 mg/dL (ref 0.0–1.0)
pH: 6 (ref 5.0–8.0)

## 2013-12-09 LAB — OB RESULTS CONSOLE GBS: STREP GROUP B AG: NEGATIVE

## 2013-12-09 LAB — OB RESULTS CONSOLE GC/CHLAMYDIA
Chlamydia: NEGATIVE
Gonorrhea: NEGATIVE

## 2013-12-09 NOTE — Addendum Note (Signed)
Addended by: Shelly Coss on: 12/09/2013 04:39 PM   Modules accepted: Orders

## 2013-12-09 NOTE — Patient Instructions (Signed)
Third Trimester of Pregnancy The third trimester is from week 29 through week 42, months 7 through 9. The third trimester is a time when the fetus is growing rapidly. At the end of the ninth month, the fetus is about 20 inches in length and weighs 6-10 pounds.  BODY CHANGES Your body goes through many changes during pregnancy. The changes vary from woman to woman.   Your weight will continue to increase. You can expect to gain 25-35 pounds (11-16 kg) by the end of the pregnancy.  You may begin to get stretch marks on your hips, abdomen, and breasts.  You may urinate more often because the fetus is moving lower into your pelvis and pressing on your bladder.  You may develop or continue to have heartburn as a result of your pregnancy.  You may develop constipation because certain hormones are causing the muscles that push waste through your intestines to slow down.  You may develop hemorrhoids or swollen, bulging veins (varicose veins).  You may have pelvic pain because of the weight gain and pregnancy hormones relaxing your joints between the bones in your pelvis. Backaches may result from overexertion of the muscles supporting your posture.  You may have changes in your hair. These can include thickening of your hair, rapid growth, and changes in texture. Some women also have hair loss during or after pregnancy, or hair that feels dry or thin. Your hair will most likely return to normal after your baby is born.  Your breasts will continue to grow and be tender. A yellow discharge may leak from your breasts called colostrum.  Your belly button may stick out.  You may feel short of breath because of your expanding uterus.  You may notice the fetus "dropping," or moving lower in your abdomen.  You may have a bloody mucus discharge. This usually occurs a few days to a week before labor begins.  Your cervix becomes thin and soft (effaced) near your due date. WHAT TO EXPECT AT YOUR PRENATAL  EXAMS  You will have prenatal exams every 2 weeks until week 36. Then, you will have weekly prenatal exams. During a routine prenatal visit:  You will be weighed to make sure you and the fetus are growing normally.  Your blood pressure is taken.  Your abdomen will be measured to track your baby's growth.  The fetal heartbeat will be listened to.  Any test results from the previous visit will be discussed.  You may have a cervical check near your due date to see if you have effaced. At around 36 weeks, your caregiver will check your cervix. At the same time, your caregiver will also perform a test on the secretions of the vaginal tissue. This test is to determine if a type of bacteria, Group B streptococcus, is present. Your caregiver will explain this further. Your caregiver may ask you:  What your birth plan is.  How you are feeling.  If you are feeling the baby move.  If you have had any abnormal symptoms, such as leaking fluid, bleeding, severe headaches, or abdominal cramping.  If you have any questions. Other tests or screenings that may be performed during your third trimester include:  Blood tests that check for low iron levels (anemia).  Fetal testing to check the health, activity level, and growth of the fetus. Testing is done if you have certain medical conditions or if there are problems during the pregnancy. FALSE LABOR You may feel small, irregular contractions that   eventually go away. These are called Braxton Hicks contractions, or false labor. Contractions may last for hours, days, or even weeks before true labor sets in. If contractions come at regular intervals, intensify, or become painful, it is best to be seen by your caregiver.  SIGNS OF LABOR   Menstrual-like cramps.  Contractions that are 5 minutes apart or less.  Contractions that start on the top of the uterus and spread down to the lower abdomen and back.  A sense of increased pelvic pressure or back  pain.  A watery or bloody mucus discharge that comes from the vagina. If you have any of these signs before the 37th week of pregnancy, call your caregiver right away. You need to go to the hospital to get checked immediately. HOME CARE INSTRUCTIONS   Avoid all smoking, herbs, alcohol, and unprescribed drugs. These chemicals affect the formation and growth of the baby.  Follow your caregiver's instructions regarding medicine use. There are medicines that are either safe or unsafe to take during pregnancy.  Exercise only as directed by your caregiver. Experiencing uterine cramps is a good sign to stop exercising.  Continue to eat regular, healthy meals.  Wear a good support bra for breast tenderness.  Do not use hot tubs, steam rooms, or saunas.  Wear your seat belt at all times when driving.  Avoid raw meat, uncooked cheese, cat litter boxes, and soil used by cats. These carry germs that can cause birth defects in the baby.  Take your prenatal vitamins.  Try taking a stool softener (if your caregiver approves) if you develop constipation. Eat more high-fiber foods, such as fresh vegetables or fruit and whole grains. Drink plenty of fluids to keep your urine clear or pale yellow.  Take warm sitz baths to soothe any pain or discomfort caused by hemorrhoids. Use hemorrhoid cream if your caregiver approves.  If you develop varicose veins, wear support hose. Elevate your feet for 15 minutes, 3-4 times a day. Limit salt in your diet.  Avoid heavy lifting, wear low heal shoes, and practice good posture.  Rest a lot with your legs elevated if you have leg cramps or low back pain.  Visit your dentist if you have not gone during your pregnancy. Use a soft toothbrush to brush your teeth and be gentle when you floss.  A sexual relationship may be continued unless your caregiver directs you otherwise.  Do not travel far distances unless it is absolutely necessary and only with the approval  of your caregiver.  Take prenatal classes to understand, practice, and ask questions about the labor and delivery.  Make a trial run to the hospital.  Pack your hospital bag.  Prepare the baby's nursery.  Continue to go to all your prenatal visits as directed by your caregiver. SEEK MEDICAL CARE IF:  You are unsure if you are in labor or if your water has broken.  You have dizziness.  You have mild pelvic cramps, pelvic pressure, or nagging pain in your abdominal area.  You have persistent nausea, vomiting, or diarrhea.  You have a bad smelling vaginal discharge.  You have pain with urination. SEEK IMMEDIATE MEDICAL CARE IF:   You have a fever.  You are leaking fluid from your vagina.  You have spotting or bleeding from your vagina.  You have severe abdominal cramping or pain.  You have rapid weight loss or gain.  You have shortness of breath with chest pain.  You notice sudden or extreme swelling   of your face, hands, ankles, feet, or legs.  You have not felt your baby move in over an hour.  You have severe headaches that do not go away with medicine.  You have vision changes. Document Released: 05/22/2001 Document Revised: 06/02/2013 Document Reviewed: 07/29/2012 Exit[employer] Patient Information 2015 [employer]. This information is not intended to replace advice given to you by your health care provider. Make sure you discuss any questions you have with your health care provider.  

## 2013-12-09 NOTE — Progress Notes (Signed)
Cultures done. Doing well. Good FM. Labor precautions.

## 2013-12-10 LAB — GC/CHLAMYDIA PROBE AMP
CT Probe RNA: NEGATIVE
GC Probe RNA: NEGATIVE

## 2013-12-12 LAB — CULTURE, OB URINE

## 2013-12-12 LAB — CULTURE, BETA STREP (GROUP B ONLY)

## 2013-12-16 ENCOUNTER — Encounter: Payer: Self-pay | Admitting: Family

## 2013-12-16 ENCOUNTER — Ambulatory Visit (INDEPENDENT_AMBULATORY_CARE_PROVIDER_SITE_OTHER): Payer: Medicaid Other | Admitting: Family

## 2013-12-16 VITALS — BP 121/79 | HR 71 | Wt 194.2 lb

## 2013-12-16 DIAGNOSIS — Z3480 Encounter for supervision of other normal pregnancy, unspecified trimester: Secondary | ICD-10-CM

## 2013-12-16 DIAGNOSIS — Z3483 Encounter for supervision of other normal pregnancy, third trimester: Secondary | ICD-10-CM

## 2013-12-16 LAB — POCT URINALYSIS DIP (DEVICE)
BILIRUBIN URINE: NEGATIVE
Glucose, UA: NEGATIVE mg/dL
HGB URINE DIPSTICK: NEGATIVE
Ketones, ur: NEGATIVE mg/dL
NITRITE: NEGATIVE
Protein, ur: NEGATIVE mg/dL
SPECIFIC GRAVITY, URINE: 1.015 (ref 1.005–1.030)
Urobilinogen, UA: 0.2 mg/dL (ref 0.0–1.0)
pH: 6 (ref 5.0–8.0)

## 2013-12-16 NOTE — Progress Notes (Signed)
Doing well; reviewed GBS results.  No UTI symptoms.  Urine culture sent.

## 2013-12-28 ENCOUNTER — Telehealth: Payer: Self-pay | Admitting: General Practice

## 2013-12-28 NOTE — Telephone Encounter (Signed)
Called patient to see if she could come in tomorrow earlier for her appt. Patient verbalized understanding and stated she could be here at 11. Patient had no questions

## 2013-12-29 ENCOUNTER — Ambulatory Visit (INDEPENDENT_AMBULATORY_CARE_PROVIDER_SITE_OTHER): Payer: Medicaid Other | Admitting: Obstetrics & Gynecology

## 2013-12-29 ENCOUNTER — Telehealth (HOSPITAL_COMMUNITY): Payer: Self-pay | Admitting: *Deleted

## 2013-12-29 VITALS — BP 123/72 | HR 85 | Wt 195.7 lb

## 2013-12-29 DIAGNOSIS — O480 Post-term pregnancy: Secondary | ICD-10-CM

## 2013-12-29 LAB — POCT URINALYSIS DIP (DEVICE)
BILIRUBIN URINE: NEGATIVE
GLUCOSE, UA: 100 mg/dL — AB
Hgb urine dipstick: NEGATIVE
NITRITE: NEGATIVE
Protein, ur: NEGATIVE mg/dL
Specific Gravity, Urine: 1.025 (ref 1.005–1.030)
UROBILINOGEN UA: 0.2 mg/dL (ref 0.0–1.0)
pH: 6 (ref 5.0–8.0)

## 2013-12-29 NOTE — Progress Notes (Signed)
UA from visit this morning ran and resulted. See results under lab tab.

## 2013-12-29 NOTE — Progress Notes (Signed)
Patient reports pelvic pressure; IOL scheduled for 7/26 @ 730 pm

## 2013-12-29 NOTE — Progress Notes (Signed)
Pt needs to pick peds.  Informed about [facility].  NST is reactive. Baby is vertex. Patient is GBS negative and will be scheduled for IOL on Sunday 01/03/2014.   UA not back. RN to chart results later when test result returns

## 2013-12-29 NOTE — Telephone Encounter (Signed)
Preadmission screen  

## 2013-12-30 ENCOUNTER — Encounter (HOSPITAL_COMMUNITY): Payer: Medicaid Other | Admitting: Anesthesiology

## 2013-12-30 ENCOUNTER — Inpatient Hospital Stay (HOSPITAL_COMMUNITY): Payer: Medicaid Other | Admitting: Anesthesiology

## 2013-12-30 ENCOUNTER — Encounter (HOSPITAL_COMMUNITY): Payer: Self-pay | Admitting: *Deleted

## 2013-12-30 ENCOUNTER — Inpatient Hospital Stay (HOSPITAL_COMMUNITY)
Admission: AD | Admit: 2013-12-30 | Discharge: 2014-01-03 | DRG: 765 | Disposition: A | Discharge status: Home / Self Care | Payer: Medicaid Other | Source: Ambulatory Visit | Attending: Obstetrics & Gynecology | Admitting: Obstetrics & Gynecology

## 2013-12-30 DIAGNOSIS — O98519 Other viral diseases complicating pregnancy, unspecified trimester: Secondary | ICD-10-CM

## 2013-12-30 DIAGNOSIS — Z8249 Family history of ischemic heart disease and other diseases of the circulatory system: Secondary | ICD-10-CM

## 2013-12-30 DIAGNOSIS — Z3483 Encounter for supervision of other normal pregnancy, third trimester: Secondary | ICD-10-CM

## 2013-12-30 DIAGNOSIS — O324XX0 Maternal care for high head at term, not applicable or unspecified: Secondary | ICD-10-CM | POA: Diagnosis present

## 2013-12-30 DIAGNOSIS — O26619 Liver and biliary tract disorders in pregnancy, unspecified trimester: Secondary | ICD-10-CM | POA: Diagnosis present

## 2013-12-30 DIAGNOSIS — Z98891 History of uterine scar from previous surgery: Secondary | ICD-10-CM

## 2013-12-30 DIAGNOSIS — D696 Thrombocytopenia, unspecified: Secondary | ICD-10-CM | POA: Diagnosis present

## 2013-12-30 DIAGNOSIS — B1910 Unspecified viral hepatitis B without hepatic coma: Secondary | ICD-10-CM | POA: Diagnosis present

## 2013-12-30 DIAGNOSIS — D689 Coagulation defect, unspecified: Secondary | ICD-10-CM | POA: Diagnosis present

## 2013-12-30 DIAGNOSIS — O9912 Other diseases of the blood and blood-forming organs and certain disorders involving the immune mechanism complicating childbirth: Secondary | ICD-10-CM

## 2013-12-30 DIAGNOSIS — O479 False labor, unspecified: Secondary | ICD-10-CM | POA: Diagnosis present

## 2013-12-30 DIAGNOSIS — IMO0001 Reserved for inherently not codable concepts without codable children: Secondary | ICD-10-CM

## 2013-12-30 HISTORY — DX: Thrombocytopenia, unspecified: D69.6

## 2013-12-30 HISTORY — DX: Thrombocytopenia, unspecified: O99.119

## 2013-12-30 LAB — RPR

## 2013-12-30 LAB — COMPREHENSIVE METABOLIC PANEL
ALK PHOS: 158 U/L — AB (ref 39–117)
ALT: 17 U/L (ref 0–35)
ANION GAP: 12 (ref 5–15)
AST: 17 U/L (ref 0–37)
Albumin: 2.9 g/dL — ABNORMAL LOW (ref 3.5–5.2)
BILIRUBIN TOTAL: 0.3 mg/dL (ref 0.3–1.2)
BUN: 7 mg/dL (ref 6–23)
CO2: 22 mEq/L (ref 19–32)
Calcium: 9.4 mg/dL (ref 8.4–10.5)
Chloride: 102 mEq/L (ref 96–112)
Creatinine, Ser: 0.57 mg/dL (ref 0.50–1.10)
GFR calc Af Amer: >90 mL/min (ref >90)
GFR calc non Af Amer: >90 mL/min (ref >90)
GLUCOSE: 99 mg/dL (ref 70–99)
POTASSIUM: 4 meq/L (ref 3.7–5.3)
Sodium: 136 mEq/L — ABNORMAL LOW (ref 137–147)
Total Protein: 6.2 g/dL (ref 6.0–8.3)

## 2013-12-30 LAB — CBC
HCT: 36.6 % (ref 36.0–46.0)
Hemoglobin: 12.5 g/dL (ref 12.0–15.0)
MCH: 26.9 pg (ref 26.0–34.0)
MCHC: 34.2 g/dL (ref 30.0–36.0)
MCV: 78.7 fL (ref 78.0–100.0)
Platelets: 96 10*3/uL — ABNORMAL LOW (ref 150–400)
RBC: 4.65 MIL/uL (ref 3.87–5.11)
RDW: 15 % (ref 11.5–15.5)
WBC: 6.8 10*3/uL (ref 4.0–10.5)

## 2013-12-30 LAB — TYPE AND SCREEN
ABO/RH(D): O POS
ANTIBODY SCREEN: NEGATIVE

## 2013-12-30 MED ORDER — FENTANYL CITRATE 0.05 MG/ML IJ SOLN: 100.0000 ug | INTRAMUSCULAR | Status: DC | PRN

## 2013-12-30 MED ORDER — FENTANYL 2.5 MCG/ML BUPIVACAINE 1/10 % EPIDURAL INFUSION (WH - ANES)
INTRAMUSCULAR | Status: AC
  Filled 2013-12-30: qty 125

## 2013-12-30 MED ORDER — FENTANYL 2.5 MCG/ML BUPIVACAINE 1/10 % EPIDURAL INFUSION (WH - ANES)
14.0000 mL/h | INTRAMUSCULAR | Status: DC | PRN
  Administered 2013-12-30 – 2013-12-31 (×3): 14 mL/h via EPIDURAL
  Filled 2013-12-30: qty 125

## 2013-12-30 MED ORDER — DIPHENHYDRAMINE HCL 50 MG/ML IJ SOLN: 12.5000 mg | INTRAMUSCULAR | Status: DC | PRN

## 2013-12-30 MED ORDER — ACETAMINOPHEN 325 MG PO TABS: 650.0000 mg | ORAL_TABLET | ORAL | Status: DC | PRN

## 2013-12-30 MED ORDER — FENTANYL 2.5 MCG/ML BUPIVACAINE 1/10 % EPIDURAL INFUSION (WH - ANES): 14.0000 mL/h | INTRAMUSCULAR | Status: DC | PRN

## 2013-12-30 MED ORDER — EPHEDRINE 5 MG/ML INJ: 10.0000 mg | INTRAVENOUS | Status: DC | PRN

## 2013-12-30 MED ORDER — OXYTOCIN BOLUS FROM INFUSION: 500.0000 mL | INTRAVENOUS | Status: DC

## 2013-12-30 MED ORDER — PHENYLEPHRINE 40 MCG/ML (10ML) SYRINGE FOR IV PUSH (FOR BLOOD PRESSURE SUPPORT): 80.0000 ug | PREFILLED_SYRINGE | INTRAVENOUS | Status: DC | PRN

## 2013-12-30 MED ORDER — IBUPROFEN 600 MG PO TABS: 600.0000 mg | ORAL_TABLET | Freq: Four times a day (QID) | ORAL | Status: DC | PRN

## 2013-12-30 MED ORDER — LACTATED RINGERS IV SOLN
INTRAVENOUS | Status: DC
  Administered 2013-12-30 (×2): via INTRAVENOUS

## 2013-12-30 MED ORDER — ONDANSETRON HCL 4 MG/2ML IJ SOLN: 4.0000 mg | Freq: Four times a day (QID) | INTRAMUSCULAR | Status: DC | PRN

## 2013-12-30 MED ORDER — LACTATED RINGERS IV SOLN
500.0000 mL | Freq: Once | INTRAVENOUS | Status: AC
  Administered 2013-12-30: 500 mL via INTRAVENOUS

## 2013-12-30 MED ORDER — LACTATED RINGERS IV SOLN
500.0000 mL | INTRAVENOUS | Status: DC | PRN
  Administered 2013-12-30: 500 mL via INTRAVENOUS

## 2013-12-30 MED ORDER — LACTATED RINGERS IV SOLN
INTRAVENOUS | Status: DC
  Administered 2013-12-30: via INTRAUTERINE

## 2013-12-30 MED ORDER — TERBUTALINE SULFATE 1 MG/ML IJ SOLN: 0.2500 mg | Freq: Once | INTRAMUSCULAR | Status: AC | PRN

## 2013-12-30 MED ORDER — CITRIC ACID-SODIUM CITRATE 334-500 MG/5ML PO SOLN
30.0000 mL | ORAL | Status: DC | PRN
  Administered 2013-12-31: 30 mL via ORAL
  Filled 2013-12-30: qty 15

## 2013-12-30 MED ORDER — LIDOCAINE HCL (PF) 1 % IJ SOLN
30.0000 mL | INTRAMUSCULAR | Status: DC | PRN
Start: 2013-12-30 — End: 2013-12-31
  Filled 2013-12-30: qty 30

## 2013-12-30 MED ORDER — OXYCODONE-ACETAMINOPHEN 5-325 MG PO TABS: 1.0000 | ORAL_TABLET | ORAL | Status: DC | PRN

## 2013-12-30 MED ORDER — LIDOCAINE HCL (PF) 1 % IJ SOLN
INTRAMUSCULAR | Status: DC | PRN
  Administered 2013-12-30: 10 mL

## 2013-12-30 MED ORDER — OXYTOCIN 40 UNITS IN LACTATED RINGERS INFUSION - SIMPLE MED
1.0000 m[IU]/min | INTRAVENOUS | Status: DC
  Administered 2013-12-30: 2 m[IU]/min via INTRAVENOUS
  Filled 2013-12-30: qty 1000

## 2013-12-30 MED ORDER — OXYTOCIN 40 UNITS IN LACTATED RINGERS INFUSION - SIMPLE MED: 62.5000 mL/h | INTRAVENOUS | Status: DC

## 2013-12-30 MED ORDER — PHENYLEPHRINE 40 MCG/ML (10ML) SYRINGE FOR IV PUSH (FOR BLOOD PRESSURE SUPPORT)
PREFILLED_SYRINGE | INTRAVENOUS | Status: AC
  Filled 2013-12-30: qty 10

## 2013-12-30 NOTE — H&P (Signed)
Attestation of Attending Supervision of Obstetric Fellow: Evaluation and management procedures were performed by the Obstetric Fellow under my supervision and collaboration.  I have reviewed the Obstetric Fellow's note and chart, and I agree with the management and plan.  Verita Schneiders, MD, [place] Attending [place], [facility]

## 2013-12-30 NOTE — Progress Notes (Signed)
Andrea Serrano is a 28 y.o. G3P0020 at [redacted]w[redacted]d by ultrasound admitted for active labor  Subjective: Patient feeling well, no complaints. SROM occurred at 1930. Epidural is effective. No headaches.  Objective: BP 117/54  Pulse 74  Temp(Src) 98.4 F (36.9 C) (Oral)  Resp 20  Ht 5\' 3"  (1.6 m)  Wt 87.998 kg (194 lb)  BMI 34.37 kg/m2  SpO2 100%  LMP 03/22/2013      FHT:  FHR: 140 bpm, variability: moderate,  accelerations:  Present,  decelerations:  Present Occasional isolated decelerations unrelated to contraction pattern, resolved with position change UC:   Regular, every 4 minutes SVE:   Dilation: 7.5 Effacement (%): 80 Station: -2 Exam by:: Ronni Rumble, RN/C Kellogg, RN  Labs: Lab Results  Component Value Date   WBC 6.8 12/30/2013   HGB 12.5 12/30/2013   HCT 36.6 12/30/2013   MCV 78.7 12/30/2013   PLT 96* 12/30/2013    Assessment / Plan: Augmentation of labor, progressing well Watch FHR closely. Consider IUPC if not progressing in the next few hours.  Labor: Progressing normally Preeclampsia:  no signs or symptoms of toxicity Fetal Wellbeing:  Category II Pain Control:  Epidural I/D:  n/a Anticipated MOD:  NSVD  Toney Reil A 12/30/2013, 9:35 PM  I have seen and examined this patient and I agree with the above. Serita Grammes CNM 12:04 AM 12/31/2013

## 2013-12-30 NOTE — MAU Note (Signed)
States she had contractions starting @ 7PM, stopped after a few hours then started again. Having some spotting after exam in office yesterday. Was not told dilation. No leakage of fluid.

## 2013-12-30 NOTE — Progress Notes (Signed)
Called midwife regarding improved FHR without amnioinfusion being started. Order to Hold amnioinfusion for now per K. Brigitte Pulse, CNM. Will pass on to following nurse K.Lonell Face, RN

## 2013-12-30 NOTE — H&P (Signed)
Andrea Serrano is a 28 y.o. female presenting for active labor Contractions started on 7/21 ~ 6pm and have been increasing in severity since.   Maternal Medical History:  Reason for admission: Contractions.  Nausea.  Contractions: Onset was yesterday.   Frequency: regular.   Duration is approximately 1 minute.   Perceived severity is strong.    Fetal activity: Perceived fetal activity is normal.   Last perceived fetal movement was within the past hour.    Prenatal complications: Infection (Hep B) and thrombocytopenia.   No bleeding, HIV, hypertension, pre-eclampsia, preterm labor or substance abuse.   Prenatal Complications - Diabetes: none.    OB History   Grav Para Term Preterm Abortions TAB SAB Ect Mult Living   3    2 2     0     Past Medical History  Diagnosis Date  . Hepatitis B   . Constipation   . Thrombocytopenia affecting pregnancy, antepartum    Past Surgical History  Procedure Laterality Date  . Tooth extraction     Family History: family history includes Heart disease in her father; Hypertension in her father and mother. Social History:  reports that she has never smoked. She has never used smokeless tobacco. She reports that she does not drink alcohol or use illicit drugs.   Prenatal Transfer Tool  Maternal Diabetes: No Genetic Screening: Normal Maternal Ultrasounds/Referrals: Normal Fetal Ultrasounds or other Referrals:  None Maternal Substance Abuse:  No Significant Maternal Medications:  None Significant Maternal Lab Results:  Lab values include: Group B Strep negative, Other:  eAg-, Core+, S Ag-. Her VL is < 1000 Other Comments:  None; Her child will need Hep B Ig, Vaccine at birth.    Review of Systems  Constitutional: Negative.   HENT: Negative.   Eyes: Negative.   Cardiovascular: Negative.   Gastrointestinal: Negative for heartburn and nausea.  Genitourinary: Negative for dysuria.  Skin: Negative.   Endo/Heme/Allergies: Does not  bruise/bleed easily.    Dilation: 3.5 Effacement (%): 60 Station: -3 Exam by:: Dr. Mancel Bale Blood pressure 118/75, pulse 86, temperature 97.7 F (36.5 C), temperature source Oral, resp. rate 18, height 5\' 3"  (1.6 m), weight 194 lb (87.998 kg), last menstrual period 03/22/2013, unknown if currently breastfeeding. Maternal Exam:  Uterine Assessment: Contraction strength is moderate.  Contraction duration is 60 seconds. Contraction frequency is regular.   Abdomen: Patient reports no abdominal tenderness. Fundal height is 40.   Estimated fetal weight is 7 lbs.   Fetal presentation: vertex  Introitus: Normal vulva. Normal vagina.  Ferning test: not done.  Nitrazine test: not done. Amniotic fluid character: not assessed.  Pelvis: adequate for delivery.   Cervix: Cervix evaluated by digital exam.     Fetal Exam Fetal Monitor Review: Mode: hand-held doppler probe.   Baseline rate: 150.  Variability: moderate (6-25 bpm).   Pattern: accelerations present and no decelerations.    Fetal State Assessment: Category I - tracings are normal.     Physical Exam  Constitutional: She is oriented to person, place, and time. She appears well-developed and well-nourished.  HENT:  Head: Normocephalic and atraumatic.  Eyes: Conjunctivae and EOM are normal. Pupils are equal, round, and reactive to light.  Neck: Normal range of motion. Neck supple.  Cardiovascular: Normal rate, regular rhythm and normal heart sounds.   Respiratory: Effort normal and breath sounds normal.  GI: Soft. Bowel sounds are normal.  Genitourinary: Vagina normal and uterus normal.  Neurological: She is alert and oriented to person, place,  and time.  Skin: Skin is warm and dry.  Psychiatric: She has a normal mood and affect.    Prenatal labs: ABO, Rh: O/POS/-- (02/03 1644) Antibody: NEG (02/03 1644) Rubella: 19.20 (02/03 1644) RPR: NON REAC (04/28 1552)  HBsAg: POSITIVE (05/13 1203)  HIV: NONREACTIVE (04/28 1552)   GBS: Negative (07/01 0000)   Assessment/Plan: 28 yo G3P0020 @ [redacted]w[redacted]d by L/11 who presents in active labor. Preg c/b Hep B and thrombocytopenia.   #Labor: Expectant Management # Hep B: eAg-, Core+, S Ag-. Her VL is < 1000. Repeat Hep B Ig and viral load. Her child will need Hep B Ig, Vaccine at birth.  #Pain: Prn fentanyl, epidural if requested  #FWB: cat1 strip  #ID: GBS neg  #MOF: breast  #MOC: Declines #Circ: At Outside clinic    Tula Nakayama 12/30/2013, 11:38 AM

## 2013-12-30 NOTE — Progress Notes (Signed)
Andrea Serrano is a 28 y.o. G3P0020 at [redacted]w[redacted]d.  Subjective: Comfortable w/ epidural.  Objective: BP 122/94  Pulse 77  Temp(Src) 98.3 F (36.8 C) (Oral)  Resp 20  Ht 5\' 3"  (1.6 m)  Wt 87.998 kg (194 lb)  BMI 34.37 kg/m2  LMP 03/22/2013      FHT:  FHR: 140 bpm, variability: moderate,  accelerations:  Present,  decelerations:  Present early UC:   irregular, every 1-5 minutes, moderate SVE:   Dilation: 4.5 Effacement (%): 50 Station: -3/ballotable Exam by:: Marlou Porch, CNM  Labs: Lab Results  Component Value Date   WBC 6.8 12/30/2013   HGB 12.5 12/30/2013   HCT 36.6 12/30/2013   MCV 78.7 12/30/2013   PLT 96* 12/30/2013    Assessment / Plan: Protracted latent phase  Labor: Progressing slowly on Pitocin, will continue to increase then AROM Preeclampsia:  NA Fetal Wellbeing:  Category I Pain Control:  Epidural I/D:  n/a Anticipated MOD:  NSVD  Andrea Serrano 12/30/2013, 4:09 PM

## 2013-12-30 NOTE — Progress Notes (Signed)
Patient ID: Andrea Serrano, female   DOB: 1985-08-11, 28 y.o.   MRN: [id number]  Cervical check showed no progress from last exam. Still 6-7cm, 70% effaced, -2 station. Some anterior cervical edema. Bladder emptying appropriately with foley cath. Unable to assess fontanelles due to caput. IUPC placed without difficulty, good signal. Titrate pitocin as indicated by MVU. Recheck in 3-4 hours.

## 2013-12-30 NOTE — Anesthesia Procedure Notes (Signed)

## 2013-12-30 NOTE — Progress Notes (Signed)
Andrea Serrano is a 28 y.o. G3P0020 at [redacted]w[redacted]d.  Subjective: Comfortable w/ epidural.   Objective: BP 111/60  Pulse 80  Temp(Src) 98.3 F (36.8 C) (Oral)  Resp 20  Ht 5\' 3"  (1.6 m)  Wt 87.998 kg (194 lb)  BMI 34.37 kg/m2  SpO2 100%  LMP 03/22/2013      FHT:  FHR: 145 bpm, variability: moderate,  accelerations:  Present,  decelerations:  Present early UC:   irregular, every 1-4 minutes SVE:   Dilation: 8 Effacement (%): 80 Station: ballotable, very high BBOW to introitus. Heavy bloody show Exam by:: Andrea Serrano, CNM  Labs: Lab Results  Component Value Date   WBC 6.8 12/30/2013   HGB 12.5 12/30/2013   HCT 36.6 12/30/2013   MCV 78.7 12/30/2013   PLT 96* 12/30/2013    Assessment / Plan: Protracted latent phase  Labor: Progressing on Pitocin, will continue to increase then AROM Preeclampsia:  NA Fetal Wellbeing:  Category I Pain Control:  Epidural I/D:  n/a Anticipated MOD:  NSVD  Andrea Serrano 12/30/2013, 8:53 PM

## 2013-12-30 NOTE — Anesthesia Preprocedure Evaluation (Addendum)
Anesthesia Evaluation  Patient identified by MRN, date of birth, ID band Patient awake    Reviewed: Allergy & Precautions, H&P , Patient's Chart, lab work & pertinent test results  Airway Mallampati: II TM Distance: >3 FB Neck ROM: full    Dental  (+) Teeth Intact   Pulmonary  breath sounds clear to auscultation        Cardiovascular Rhythm:regular Rate:Normal     Neuro/Psych    GI/Hepatic (+) Hepatitis -, B  Endo/Other    Renal/GU      Musculoskeletal   Abdominal   Peds  Hematology Thrombocytopenia affecting pregnancy,96K plts            Anesthesia Other Findings       Reproductive/Obstetrics (+) Pregnancy                          Anesthesia Physical Anesthesia Plan  ASA: II  Anesthesia Plan: Epidural   Post-op Pain Management:    Induction:   Airway Management Planned:   Additional Equipment:   Intra-op Plan:   Post-operative Plan:   Informed Consent: I have reviewed the patients History and Physical, chart, labs and discussed the procedure including the risks, benefits and alternatives for the proposed anesthesia with the patient or authorized representative who has indicated his/her understanding and acceptance.   Dental Advisory Given  Plan Discussed with:   Anesthesia Plan Comments: (Labs checked- platelets confirmed with RN in room. Fetal heart tracing, per RN, reported to be stable enough for sitting procedure. Discussed epidural, and patient consents to the procedure:  included risk of possible headache,backache, failed block, allergic reaction, and nerve injury. This patient was asked if she had any questions or concerns before the procedure started.)        Anesthesia Quick Evaluation

## 2013-12-31 ENCOUNTER — Encounter (HOSPITAL_COMMUNITY): Payer: Self-pay

## 2013-12-31 ENCOUNTER — Encounter (HOSPITAL_COMMUNITY)
Admission: AD | Disposition: A | Discharge status: Home / Self Care | Payer: Self-pay | Source: Ambulatory Visit | Attending: Obstetrics & Gynecology

## 2013-12-31 DIAGNOSIS — Z98891 History of uterine scar from previous surgery: Secondary | ICD-10-CM

## 2013-12-31 DIAGNOSIS — O98519 Other viral diseases complicating pregnancy, unspecified trimester: Secondary | ICD-10-CM

## 2013-12-31 LAB — CBC
HCT: 33.1 % — ABNORMAL LOW (ref 36.0–46.0)
HEMATOCRIT: 33 % — AB (ref 36.0–46.0)
HEMOGLOBIN: 11.2 g/dL — AB (ref 12.0–15.0)
Hemoglobin: 10.9 g/dL — ABNORMAL LOW (ref 12.0–15.0)
MCH: 26.8 pg (ref 26.0–34.0)
MCH: 26 pg (ref 26.0–34.0)
MCHC: 33.8 g/dL (ref 30.0–36.0)
MCHC: 33 g/dL (ref 30.0–36.0)
MCV: 79.2 fL (ref 78.0–100.0)
MCV: 78.8 fL (ref 78.0–100.0)
Platelets: 97 10*3/uL — ABNORMAL LOW (ref 150–400)
Platelets: 99 10*3/uL — ABNORMAL LOW (ref 150–400)
RBC: 4.18 MIL/uL (ref 3.87–5.11)
RBC: 4.19 MIL/uL (ref 3.87–5.11)
RDW: 15.2 % (ref 11.5–15.5)
RDW: 15.1 % (ref 11.5–15.5)
WBC: 11.8 10*3/uL — ABNORMAL HIGH (ref 4.0–10.5)
WBC: 13.9 10*3/uL — AB (ref 4.0–10.5)

## 2013-12-31 LAB — HEPATITIS B SURF AG CONFIRMATION: Hepatitis B Surf Ag Confirmation: POSITIVE — AB

## 2013-12-31 LAB — HEPATITIS B CORE ANTIBODY, TOTAL: HEP B C TOTAL AB: REACTIVE — AB

## 2013-12-31 LAB — HEPATITIS B CORE ANTIBODY, IGM: Hep B C IgM: NONREACTIVE

## 2013-12-31 LAB — HEPATITIS B SURFACE ANTIGEN: Hepatitis B Surface Ag: POSITIVE — AB

## 2013-12-31 LAB — HEPATITIS B SURFACE ANTIBODY,QUALITATIVE: HEP B S AB: NEGATIVE

## 2013-12-31 SURGERY — Surgical Case
Anesthesia: Epidural | Site: Abdomen

## 2013-12-31 MED ORDER — KETOROLAC TROMETHAMINE 60 MG/2ML IM SOLN: 60.0000 mg | Freq: Once | INTRAMUSCULAR | Status: DC | PRN

## 2013-12-31 MED ORDER — DEXAMETHASONE SODIUM PHOSPHATE 10 MG/ML IJ SOLN
INTRAMUSCULAR | Status: DC | PRN
  Administered 2013-12-31: 10 mg via INTRAVENOUS

## 2013-12-31 MED ORDER — DIBUCAINE 1 % RE OINT: 1.0000 "application " | TOPICAL_OINTMENT | RECTAL | Status: DC | PRN

## 2013-12-31 MED ORDER — SIMETHICONE 80 MG PO CHEW
80.0000 mg | CHEWABLE_TABLET | ORAL | Status: DC
  Administered 2014-01-01 – 2014-01-02 (×3): 80 mg via ORAL
  Filled 2013-12-31 (×3): qty 1

## 2013-12-31 MED ORDER — MORPHINE SULFATE 0.5 MG/ML IJ SOLN
INTRAMUSCULAR | Status: AC
  Filled 2013-12-31: qty 10

## 2013-12-31 MED ORDER — SCOPOLAMINE 1 MG/3DAYS TD PT72
MEDICATED_PATCH | TRANSDERMAL | Status: AC
  Filled 2013-12-31: qty 1

## 2013-12-31 MED ORDER — ONDANSETRON HCL 4 MG/2ML IJ SOLN
INTRAMUSCULAR | Status: DC | PRN
  Administered 2013-12-31: 4 mg via INTRAVENOUS

## 2013-12-31 MED ORDER — ONDANSETRON HCL 4 MG/2ML IJ SOLN
INTRAMUSCULAR | Status: AC
  Filled 2013-12-31: qty 2

## 2013-12-31 MED ORDER — OXYCODONE HCL 5 MG PO TABS: 5.0000 mg | ORAL_TABLET | Freq: Once | ORAL | Status: DC | PRN

## 2013-12-31 MED ORDER — DIPHENHYDRAMINE HCL 50 MG/ML IJ SOLN: 12.5000 mg | INTRAMUSCULAR | Status: DC | PRN

## 2013-12-31 MED ORDER — OXYTOCIN 10 UNIT/ML IJ SOLN
INTRAMUSCULAR | Status: AC
  Filled 2013-12-31: qty 4

## 2013-12-31 MED ORDER — KETOROLAC TROMETHAMINE 30 MG/ML IJ SOLN: 30.0000 mg | Freq: Four times a day (QID) | INTRAMUSCULAR | Status: AC | PRN

## 2013-12-31 MED ORDER — PRENATAL VITAMINS 0.8 MG PO TABS: 1.0000 | ORAL_TABLET | Freq: Every day | ORAL | Status: DC

## 2013-12-31 MED ORDER — SODIUM CHLORIDE 0.9 % IJ SOLN
INTRAMUSCULAR | Status: AC
  Filled 2013-12-31: qty 50

## 2013-12-31 MED ORDER — MEPERIDINE HCL 25 MG/ML IJ SOLN: 6.2500 mg | INTRAMUSCULAR | Status: DC | PRN

## 2013-12-31 MED ORDER — PRENATAL MULTIVITAMIN CH
1.0000 | ORAL_TABLET | Freq: Every day | ORAL | Status: DC
  Administered 2013-12-31 – 2014-01-03 (×4): 1 via ORAL
  Filled 2013-12-31 (×4): qty 1

## 2013-12-31 MED ORDER — SIMETHICONE 80 MG PO CHEW: 80.0000 mg | CHEWABLE_TABLET | ORAL | Status: DC | PRN

## 2013-12-31 MED ORDER — TETANUS-DIPHTH-ACELL PERTUSSIS 5-2.5-18.5 LF-MCG/0.5 IM SUSP: 0.5000 mL | Freq: Once | INTRAMUSCULAR | Status: DC

## 2013-12-31 MED ORDER — SODIUM CHLORIDE 0.9 % IJ SOLN: 3.0000 mL | INTRAMUSCULAR | Status: DC | PRN

## 2013-12-31 MED ORDER — MORPHINE SULFATE (PF) 0.5 MG/ML IJ SOLN
INTRAMUSCULAR | Status: DC | PRN
  Administered 2013-12-31: 3 mg via EPIDURAL

## 2013-12-31 MED ORDER — OXYTOCIN 40 UNITS IN LACTATED RINGERS INFUSION - SIMPLE MED: 62.5000 mL/h | INTRAVENOUS | Status: AC

## 2013-12-31 MED ORDER — SODIUM BICARBONATE 8.4 % IV SOLN
INTRAVENOUS | Status: AC
  Filled 2013-12-31: qty 50

## 2013-12-31 MED ORDER — DIPHENHYDRAMINE HCL 25 MG PO CAPS: 25.0000 mg | ORAL_CAPSULE | ORAL | Status: DC | PRN

## 2013-12-31 MED ORDER — ONDANSETRON HCL 4 MG/2ML IJ SOLN: 4.0000 mg | Freq: Three times a day (TID) | INTRAMUSCULAR | Status: DC | PRN

## 2013-12-31 MED ORDER — ONDANSETRON HCL 4 MG PO TABS: 4.0000 mg | ORAL_TABLET | ORAL | Status: DC | PRN

## 2013-12-31 MED ORDER — NALOXONE HCL 0.4 MG/ML IJ SOLN: 0.4000 mg | INTRAMUSCULAR | Status: DC | PRN

## 2013-12-31 MED ORDER — LANOLIN HYDROUS EX OINT: 1.0000 "application " | TOPICAL_OINTMENT | CUTANEOUS | Status: DC | PRN

## 2013-12-31 MED ORDER — CEFAZOLIN SODIUM-DEXTROSE 2-3 GM-% IV SOLR
INTRAVENOUS | Status: AC
  Filled 2013-12-31: qty 50

## 2013-12-31 MED ORDER — NALBUPHINE HCL 10 MG/ML IJ SOLN
5.0000 mg | INTRAMUSCULAR | Status: DC | PRN
Start: 2013-12-31 — End: 2014-01-03

## 2013-12-31 MED ORDER — OXYCODONE HCL 5 MG/5ML PO SOLN: 5.0000 mg | Freq: Once | ORAL | Status: DC | PRN

## 2013-12-31 MED ORDER — SODIUM CHLORIDE 0.9 % IJ SOLN
INTRAMUSCULAR | Status: DC | PRN
  Administered 2013-12-31: 20 mL via INTRAVENOUS

## 2013-12-31 MED ORDER — SENNOSIDES-DOCUSATE SODIUM 8.6-50 MG PO TABS
2.0000 | ORAL_TABLET | ORAL | Status: DC
  Administered 2014-01-01 – 2014-01-02 (×3): 2 via ORAL
  Filled 2013-12-31 (×3): qty 2

## 2013-12-31 MED ORDER — BUPIVACAINE LIPOSOME 1.3 % IJ SUSP
20.0000 mL | Freq: Once | INTRAMUSCULAR | Status: AC
  Administered 2013-12-31: 20 mL
  Filled 2013-12-31: qty 20

## 2013-12-31 MED ORDER — IBUPROFEN 600 MG PO TABS
600.0000 mg | ORAL_TABLET | Freq: Four times a day (QID) | ORAL | Status: DC
  Administered 2013-12-31 – 2014-01-03 (×13): 600 mg via ORAL
  Filled 2013-12-31 (×13): qty 1

## 2013-12-31 MED ORDER — OXYTOCIN 10 UNIT/ML IJ SOLN
40.0000 [IU] | INTRAVENOUS | Status: DC | PRN
  Administered 2013-12-31: 40 [IU] via INTRAVENOUS

## 2013-12-31 MED ORDER — METOCLOPRAMIDE HCL 5 MG/ML IJ SOLN: 10.0000 mg | Freq: Three times a day (TID) | INTRAMUSCULAR | Status: DC | PRN

## 2013-12-31 MED ORDER — LACTATED RINGERS IV SOLN
INTRAVENOUS | Status: DC
  Administered 2013-12-31: 15:00:00 via INTRAVENOUS

## 2013-12-31 MED ORDER — PROMETHAZINE HCL 25 MG/ML IJ SOLN: 6.2500 mg | INTRAMUSCULAR | Status: DC | PRN

## 2013-12-31 MED ORDER — ZOLPIDEM TARTRATE 5 MG PO TABS: 5.0000 mg | ORAL_TABLET | Freq: Every evening | ORAL | Status: DC | PRN

## 2013-12-31 MED ORDER — ONDANSETRON HCL 4 MG/2ML IJ SOLN: 4.0000 mg | INTRAMUSCULAR | Status: DC | PRN

## 2013-12-31 MED ORDER — OXYCODONE-ACETAMINOPHEN 5-325 MG PO TABS
1.0000 | ORAL_TABLET | ORAL | Status: DC | PRN
  Administered 2013-12-31 – 2014-01-03 (×5): 1 via ORAL
  Filled 2013-12-31 (×5): qty 1

## 2013-12-31 MED ORDER — MENTHOL 3 MG MT LOZG: 1.0000 | LOZENGE | OROMUCOSAL | Status: DC | PRN

## 2013-12-31 MED ORDER — HYDROMORPHONE HCL PF 1 MG/ML IJ SOLN: 0.2500 mg | INTRAMUSCULAR | Status: DC | PRN

## 2013-12-31 MED ORDER — SCOPOLAMINE 1 MG/3DAYS TD PT72
1.0000 | MEDICATED_PATCH | Freq: Once | TRANSDERMAL | Status: AC
  Administered 2013-12-31: 1.5 mg via TRANSDERMAL

## 2013-12-31 MED ORDER — NALBUPHINE HCL 10 MG/ML IJ SOLN: 5.0000 mg | INTRAMUSCULAR | Status: DC | PRN

## 2013-12-31 MED ORDER — CEFAZOLIN SODIUM-DEXTROSE 2-3 GM-% IV SOLR
INTRAVENOUS | Status: DC | PRN
  Administered 2013-12-31: 2 g via INTRAVENOUS

## 2013-12-31 MED ORDER — DIPHENHYDRAMINE HCL 25 MG PO CAPS
25.0000 mg | ORAL_CAPSULE | Freq: Four times a day (QID) | ORAL | Status: DC | PRN
Start: 2013-12-31 — End: 2014-01-03

## 2013-12-31 MED ORDER — LACTATED RINGERS IV SOLN
INTRAVENOUS | Status: DC | PRN
  Administered 2013-12-31: 02:00:00 via INTRAVENOUS

## 2013-12-31 MED ORDER — LIDOCAINE-EPINEPHRINE (PF) 2 %-1:200000 IJ SOLN
INTRAMUSCULAR | Status: AC
Start: 2013-12-31 — End: 2013-12-31
  Filled 2013-12-31: qty 20

## 2013-12-31 MED ORDER — MEPERIDINE HCL 25 MG/ML IJ SOLN
INTRAMUSCULAR | Status: DC | PRN
  Administered 2013-12-31 (×2): 12.5 mg via INTRAVENOUS

## 2013-12-31 MED ORDER — WITCH HAZEL-GLYCERIN EX PADS: 1.0000 "application " | MEDICATED_PAD | CUTANEOUS | Status: DC | PRN

## 2013-12-31 MED ORDER — NALOXONE HCL 1 MG/ML IJ SOLN
1.0000 ug/kg/h | INTRAVENOUS | Status: DC | PRN
  Filled 2013-12-31: qty 2

## 2013-12-31 MED ORDER — SODIUM BICARBONATE 8.4 % IV SOLN
INTRAVENOUS | Status: DC | PRN
  Administered 2013-12-31: 10 mL via EPIDURAL

## 2013-12-31 MED ORDER — MEPERIDINE HCL 25 MG/ML IJ SOLN
INTRAMUSCULAR | Status: AC
  Filled 2013-12-31: qty 1

## 2013-12-31 MED ORDER — DEXAMETHASONE SODIUM PHOSPHATE 10 MG/ML IJ SOLN
INTRAMUSCULAR | Status: AC
  Filled 2013-12-31: qty 1

## 2013-12-31 MED ORDER — SIMETHICONE 80 MG PO CHEW
80.0000 mg | CHEWABLE_TABLET | Freq: Three times a day (TID) | ORAL | Status: DC
  Administered 2013-12-31 – 2014-01-03 (×9): 80 mg via ORAL
  Filled 2013-12-31 (×9): qty 1

## 2013-12-31 MED ORDER — DIPHENHYDRAMINE HCL 50 MG/ML IJ SOLN: 25.0000 mg | INTRAMUSCULAR | Status: DC | PRN

## 2013-12-31 SURGICAL SUPPLY — 37 items
CLAMP CORD UMBIL (MISCELLANEOUS) ×3 IMPLANT
CLOTH BEACON ORANGE TIMEOUT ST (SAFETY) ×3 IMPLANT
DERMABOND ADVANCED (GAUZE/BANDAGES/DRESSINGS) ×4
DERMABOND ADVANCED .7 DNX12 (GAUZE/BANDAGES/DRESSINGS) ×2 IMPLANT
DRAPE LG THREE QUARTER DISP (DRAPES) ×3 IMPLANT
DRSG OPSITE POSTOP 4X10 (GAUZE/BANDAGES/DRESSINGS) ×3 IMPLANT
DURAPREP 26ML APPLICATOR (WOUND CARE) ×6 IMPLANT
ELECT REM PT RETURN 9FT ADLT (ELECTROSURGICAL) ×3
ELECTRODE REM PT RTRN 9FT ADLT (ELECTROSURGICAL) ×1 IMPLANT
EXTRACTOR VACUUM BELL STYLE (SUCTIONS) ×3 IMPLANT
GLOVE BIOGEL PI IND STRL 8 (GLOVE) ×1 IMPLANT
GLOVE BIOGEL PI INDICATOR 8 (GLOVE) ×2
GLOVE ECLIPSE 8.0 STRL XLNG CF (GLOVE) ×3 IMPLANT
GOWN STRL REUS W/TWL LRG LVL3 (GOWN DISPOSABLE) ×6 IMPLANT
KIT ABG SYR 3ML LUER SLIP (SYRINGE) ×3 IMPLANT
NEEDLE HYPO 18GX1.5 BLUNT FILL (NEEDLE) ×3 IMPLANT
NEEDLE HYPO 22GX1.5 SAFETY (NEEDLE) ×3 IMPLANT
NEEDLE HYPO 25X5/8 SAFETYGLIDE (NEEDLE) ×3 IMPLANT
NS IRRIG 1000ML POUR BTL (IV SOLUTION) ×3 IMPLANT
PACK C SECTION WH (CUSTOM PROCEDURE TRAY) ×3 IMPLANT
PAD OB MATERNITY 4.3X12.25 (PERSONAL CARE ITEMS) ×3 IMPLANT
RTRCTR C-SECT PINK 25CM LRG (MISCELLANEOUS) ×3 IMPLANT
SPONGE LAP 18X18 X RAY DECT (DISPOSABLE) ×3 IMPLANT
STAPLER VISISTAT 35W (STAPLE) IMPLANT
SUT CHROMIC 0 CT 1 (SUTURE) ×3 IMPLANT
SUT MNCRL 0 VIOLET CTX 36 (SUTURE) ×2 IMPLANT
SUT MONOCRYL 0 CTX 36 (SUTURE) ×4
SUT PLAIN 2 0 (SUTURE)
SUT PLAIN 2 0 XLH (SUTURE) IMPLANT
SUT PLAIN ABS 2-0 CT1 27XMFL (SUTURE) IMPLANT
SUT VIC AB 0 CTX 36 (SUTURE) ×2
SUT VIC AB 0 CTX36XBRD ANBCTRL (SUTURE) ×1 IMPLANT
SUT VIC AB 4-0 KS 27 (SUTURE) ×3 IMPLANT
SYR 20CC LL (SYRINGE) ×6 IMPLANT
TOWEL OR 17X24 6PK STRL BLUE (TOWEL DISPOSABLE) ×3 IMPLANT
TRAY FOLEY CATH 14FR (SET/KITS/TRAYS/PACK) IMPLANT
WATER STERILE IRR 1000ML POUR (IV SOLUTION) IMPLANT

## 2013-12-31 NOTE — Progress Notes (Signed)
I have seen and examined this patient and I agree with the above. Serita Grammes CNM 12:04 AM 12/31/2013

## 2013-12-31 NOTE — Transfer of Care (Signed)
Immediate Anesthesia Transfer of Care Note  Patient: Andrea Serrano  Procedure(s) Performed: Procedure(s): Primary Cesarean Section with Delivery Baby Boy @ 0157,  Apgars 8/9 (N/A)  Patient Location: PACU  Anesthesia Type:Epidural  Level of Consciousness: awake, alert , oriented and patient cooperative  Airway & Oxygen Therapy: Patient Spontanous Breathing  Post-op Assessment: Report given to PACU RN, Post -op Vital signs reviewed and stable and Patient moving all extremities X 4  Post vital signs: Reviewed and stable  Complications: No apparent anesthesia complications

## 2013-12-31 NOTE — Progress Notes (Signed)
Patient ID: Andrea Serrano, female   DOB: Jan 04, 1986, 28 y.o.   MRN: [id number]  CTSP for FHR variables over the past hour, not consistent with every ctx; at times early onset, at times late onset.  Position changes, IVF bolus, and O2 in place and amnioinfusion started. Baseline 150s, +accels approx 1 hr ago, now with decreased variability compared with earlier tracing. MVUs adequate with 47mu/min Pitocin but ctx pattern irreg at times with some coupling Cx deferred- will wait until 0030 in order to have 2 hrs of adequate ctx  SHAW, [facility] 12/31/2013 12:02 AM

## 2013-12-31 NOTE — Lactation Note (Signed)
This note was copied from the chart of Andrea Serrano. Lactation Consultation Note  Patient Name: Andrea Janyce Ellinger [id number] Date: 12/31/2013 Reason for consult: Initial assessment  Infant 16.5 hrs old at time of visit.  Mom P1.  Chart review - Breastfed x3 (current feeding + 15-20 min feeds on/off) + 4 (0-8 min feeds); voids-2; stools-2.  C-Section with Vacuum assisted delivery with EBL -1,000 ml.  Mom stated the right nipple is too large for infant's mouth and she has been feeding mostly on left.  LC assisted mom with latching infant on right STS.  Right nipple is very large and infant kept thrusting out of mouth.  LC assessed sucking with gloved finger.  Infant sucked finger and was humping back of tongue, thrusting finger out, and biting.  Taught mom hand expression with return demonstration and colostrum pouring from right nipple.  Spoon fed 3 ml and then latched infant on and used curved-tip syringe at breast with an additional 2 ml as an attempt to keep infant latched and in a sucking rhythm but infant kept tongue thrusting nipple out.  Discussed with mom to give infant lots of practice breastfeeding on right side.  If she could not get infant to stay latched on right then hand express right side and spoon feed back to infant but continue latching infant to left side too.  Total EBM fed this feeding 5 ml.  Re-latched infant to right side prior to [facility] leaving room.  Educated on feeding cues and encouraged to feed with cues skin-to-skin.  Lactation brochure given and informed of hospital support group and outpatient services.  Encouraged to call for assistance if needed.  Discussed consult with RN.    Maternal Data Formula Feeding for Exclusion: No Has patient been taught Hand Expression?: Yes Does the patient have breastfeeding experience prior to this delivery?: No  Feeding Feeding Type: Breast Fed Length of feed: 20 min  LATCH Score/Interventions Latch: Repeated attempts needed to  sustain latch, nipple held in mouth throughout feeding, stimulation needed to elicit sucking reflex. (lg right nipple; tongue thrusting and biting) Intervention(s): Adjust position;Assist with latch;Breast compression  Audible Swallowing: None Intervention(s): Hand expression  Type of Nipple: Everted at rest and after stimulation  Comfort (Breast/Nipple): Soft / non-tender     Hold (Positioning): Assistance needed to correctly position infant at breast and maintain latch. Intervention(s): Breastfeeding basics reviewed;Support Pillows;Skin to skin  LATCH Score: 6  Lactation Tools Discussed/Used Tools: Other (comment) (spoon and curved-tip at breast)   Consult Status Consult Status: Follow-up Date: 01/01/14 Follow-up type: In-patient    Merlene Laughter 12/31/2013, 8:03 PM

## 2013-12-31 NOTE — Op Note (Signed)
Preoperative diagnosis:  1.  Intrauterine pregnancy at [redacted]w[redacted]d  weeks gestation                                         2.  Secondary arrest of descent and dilatation                                         3.  Fetal variable decelerations   Postoperative diagnosis:  Same as above plus asynclitism   Procedure:  Primary cesarean section  Surgeon:  Florian Buff MD  Assistant:    Anesthesia: Spinal  Findings:  Pt had no dilatation or descent over a 5 hour period despite adequate labor.  In fact cervix was swelling and regressing.  The baby was ROP and asynclitic.  Over a low transverse incision was delivered a viable female with Apgars of 8 and 9 weighing  lbs.  oz. Uterus, tubes and ovaries were all normal.  There were no other significant findings  Description of operation:  Patient was taken to the operating room and placed in the sitting position where she underwent a spinal anesthetic. She was then placed in the supine position with tilt to the left side. When adequate anesthetic level was obtained she was prepped and draped in usual sterile fashion and a Foley catheter was placed. A Pfannenstiel skin incision was made and carried down sharply to the rectus fascia which was scored in the midline extended laterally. The fascia was taken off the muscles both superiorly and without difficulty. The muscles were divided.  The peritoneal cavity was entered.  Bladder blade was placed, no bladder flap was created.  A low transverse hysterotomy incision was made and delivered a viable female  infant at 53 with Apgars of 8 and 9 weighing lbs  oz.  Cord pH was obtained and was pending. The uterus was exteriorized. It was closed in 2 layers, the first being a running interlocking layer and the second being an imbricating layer using 0 monocryl on a CTX needle. There was good resulting hemostasis. The uterus tubes and ovaries were all normal. Peritoneal cavity was irrigated vigorously. The muscles and  peritoneum were reapproximated loosely. The fascia was closed using 0 Vicryl in running fashion. Subcutaneous tissue was made hemostatic and irrigated. The skin was closed using 4-0 Vicryl on a Keith needle in a subcuticular fashion.  Dermabond was placed for additional wound integrity and to serve as a barrier. Blood loss for the procedure was 1000 cc. The patient received a gram of Ancef prophylactically. The patient was taken to the recovery room in good stable condition with all counts being correct x3.  EBL 1000 cc  EURE,LUTHER H 12/31/2013 2:32 AM

## 2013-12-31 NOTE — Anesthesia Postprocedure Evaluation (Signed)
  Anesthesia Post-op Note  Patient: Andrea Serrano  Procedure(s) Performed: Procedure(s): Primary Cesarean Section with Delivery Baby Boy @ 0157,  Apgars 8/9 (N/A)  Patient Location: Mother/Baby  Anesthesia Type:Epidural  Level of Consciousness: awake, alert , oriented and patient cooperative  Airway and Oxygen Therapy: Patient Spontanous Breathing  Post-op Pain: mild  Post-op Assessment: Post-op Vital signs reviewed, Patient's Cardiovascular Status Stable, Respiratory Function Stable, Patent Airway, No signs of Nausea or vomiting, Adequate PO intake and Pain level controlled  Post-op Vital Signs: Reviewed and stable  Last Vitals:  Filed Vitals:   12/31/13 0800  BP: 124/78  Pulse: 75  Temp: 36.4 C  Resp: 18    Complications: No apparent anesthesia complications

## 2014-01-01 ENCOUNTER — Other Ambulatory Visit: Payer: Medicaid Other

## 2014-01-01 ENCOUNTER — Encounter (HOSPITAL_COMMUNITY): Payer: Self-pay | Admitting: Obstetrics & Gynecology

## 2014-01-01 LAB — HEPATITIS B E ANTIGEN: Hep B E Ag: NONREACTIVE

## 2014-01-01 LAB — HEPATITIS B E ANTIBODY: HEP B E AB: REACTIVE — AB

## 2014-01-01 NOTE — Progress Notes (Signed)
UR chart review completed.  

## 2014-01-01 NOTE — Progress Notes (Signed)
Subjective: Postpartum Day 1: Cesarean Delivery Patient reports tolerating PO.  Up ad lib, minimal pain controlled on percocet. She feels much improvement today. Minimal bleeding. No flatus or BM yet.   Objective: Vital signs in last 24 hours: Temp:  [97.6 F (36.4 C)-98.5 F (36.9 C)] 98.5 F (36.9 C) (07/24 0549) Pulse Rate:  [63-85] 75 (07/24 0549) Resp:  [16-20] 20 (07/24 0549) BP: (98-124)/(53-78) 103/78 mmHg (07/24 0549) SpO2:  [96 %-100 %] 100 % (07/24 0005)  Physical Exam:  General: alert, cooperative and no distress Lochia: appropriate Uterine Fundus: firm Incision: healing well, no significant drainage, no dehiscence, no significant erythema DVT Evaluation: No evidence of DVT seen on physical exam. No cords or calf tenderness.   Recent Labs  12/31/13 0319 12/31/13 0720  HGB 11.2* 10.9*  HCT 33.1* 33.0*    Assessment/Plan: Status post Cesarean section. Doing well postoperatively.  Continue current care.   Melancon, Caleb G 01/01/2014, 7:25 AM  I have seen and examined this patient and agree the above assessment. CRESENZO-DISHMAN,Kavian Peters 01/01/2014 7:56 AM

## 2014-01-01 NOTE — Lactation Note (Signed)
This note was copied from the chart of Andrea Serrano. Lactation Consultation Note Mom states breastfeeding is going much better; states a nurse helped her yesterday which made a big difference. Baby crying at this time from having his assessment done. Offered to assist with a feeding, mom states she is ok to feed baby without assistance. Latch score 10 at this time.  Enc mom to call if she has any concerns.   Patient Name: Andrea Valerya Maxton [id number] Date: 01/01/2014 Reason for consult: Follow-up assessment   Maternal Data    Feeding Feeding Type: Breast Fed Length of feed: 20 min  LATCH Score/Interventions Latch: Grasps breast easily, tongue down, lips flanged, rhythmical sucking.  Audible Swallowing: Spontaneous and intermittent  Type of Nipple: Everted at rest and after stimulation  Comfort (Breast/Nipple): Soft / non-tender     Hold (Positioning): No assistance needed to correctly position infant at breast.  LATCH Score: 10  Lactation Tools Discussed/Used     Consult Status Consult Status: PRN    Dorise Bullion 01/01/2014, 10:56 AM

## 2014-01-02 NOTE — Progress Notes (Signed)
Subjective: Postpartum Day 2: Cesarean Delivery Patient reports tolerating PO, + flatus, + BM and no problems voiding.  Breastfeeding well. No complaints.   Objective: Vital signs in last 24 hours: Temp:  [97.6 F (36.4 C)-98.4 F (36.9 C)] 97.6 F (36.4 C) (07/25 0533) Pulse Rate:  [57-74] 57 (07/25 0533) Resp:  [16-20] 16 (07/25 0533) BP: (108-111)/(57-66) 108/57 mmHg (07/25 0533) SpO2:  [100 %] 100 % (07/25 0533)  Physical Exam:  General: alert, cooperative and no distress Lochia: appropriate Uterine Fundus: firm Incision: healing well, no significant drainage, no dehiscence, no significant erythema DVT Evaluation: No evidence of DVT seen on physical exam. No cords or calf tenderness.   Recent Labs  12/31/13 0319 12/31/13 0720  HGB 11.2* 10.9*  HCT 33.1* 33.0*    Assessment/Plan: Status post Cesarean section. Doing well postoperatively.  Continue current care. Pt. Planning for discussion about birth control at follow up appointment. Undecided about Mirena vs. Nexplenon. Plan for discharge tomorrow.   Laprecious Austill G 01/02/2014, 7:42 AM

## 2014-01-02 NOTE — Progress Notes (Signed)
I have seen and examined this patient and I agree with the above. Serita Grammes CNM 8:42 AM 01/02/2014

## 2014-01-03 ENCOUNTER — Inpatient Hospital Stay (HOSPITAL_COMMUNITY): Admission: RE | Admit: 2014-01-03 | Payer: Medicaid Other | Source: Ambulatory Visit

## 2014-01-03 ENCOUNTER — Other Ambulatory Visit: Payer: Self-pay | Admitting: Family

## 2014-01-03 MED ORDER — ACETAMINOPHEN-CODEINE 300-30 MG PO TABS: 1.0000 | ORAL_TABLET | ORAL | Status: DC | PRN

## 2014-01-03 MED ORDER — IBUPROFEN 600 MG PO TABS: 600.0000 mg | ORAL_TABLET | Freq: Four times a day (QID) | ORAL | Status: DC | PRN

## 2014-01-03 MED ORDER — IBUPROFEN 800 MG PO TABS: 800.0000 mg | ORAL_TABLET | Freq: Three times a day (TID) | ORAL | Status: DC | PRN

## 2014-01-03 MED ORDER — OXYCODONE-ACETAMINOPHEN 5-325 MG PO TABS: 1.0000 | ORAL_TABLET | ORAL | Status: DC | PRN

## 2014-01-03 NOTE — Discharge Summary (Signed)
Obstetric Discharge Summary Reason for Admission: induction of labor Prenatal Procedures: ultrasound Intrapartum Procedures: cesarean: low cervical, transverse Postpartum Procedures: none Complications-Operative and Postpartum: none HGB  Date Value Ref Range Status  10/29/2013 11.4* 11.6 - 15.9 g/dL Final     Hemoglobin  Date Value Ref Range Status  12/31/2013 10.9* 12.0 - 15.0 g/dL Final     HCT  Date Value Ref Range Status  12/31/2013 33.0* 36.0 - 46.0 % Final  10/29/2013 33.8* 34.8 - 46.6 % Final    Physical Exam:  General: alert, cooperative and no distress Lochia: appropriate Uterine Fundus: firm Incision: healing well, no significant drainage, no dehiscence, no significant erythema DVT Evaluation: No evidence of DVT seen on physical exam.  Discharge Diagnoses: Term Pregnancy-delivered and Caesarean  Discharge Information: Date: 01/03/2014 Activity: pelvic rest Diet: routine Medications: PNV, Ibuprofen and Percocet Condition: stable Instructions: refer to practice specific booklet Discharge to: home Follow-up Information   Follow up with [place] In 6 weeks. (post partum evaluation)    Contact information:   [place] [address] [phone]      Newborn Data: Live born female  Birth Weight: 7 lb 11.1 oz (3491 g) APGAR: 8, 9  Home with mother.  Ashonti Leandro H 01/03/2014, 9:44 AM

## 2014-01-03 NOTE — Discharge Instructions (Signed)

## 2014-01-03 NOTE — Progress Notes (Signed)
Pt reports had RX for Percocet and ibuprofen filled by threw them away by accident.  Refilled ibuprofen, gave RX for Tylenol#3 (#15).

## 2014-02-01 ENCOUNTER — Ambulatory Visit (INDEPENDENT_AMBULATORY_CARE_PROVIDER_SITE_OTHER): Payer: Medicaid Other | Admitting: Obstetrics & Gynecology

## 2014-02-01 ENCOUNTER — Encounter: Payer: Self-pay | Admitting: Obstetrics & Gynecology

## 2014-02-01 NOTE — Patient Instructions (Signed)

## 2014-02-01 NOTE — Progress Notes (Signed)
Patient ID: Corrine Tillis, female   DOB: [birth year], 28 y.o.   MRN: [id number] Subjective:     Ramya Vanbergen is a 28 y.o. female who presents for a postpartum visit. She is 4 weeks postpartum following a low cervical transverse Cesarean section. I have fully reviewed the prenatal and intrapartum course. The delivery was at 40 3/7 gestational weeks. Outcome: primary cesarean section, low transverse incision. Anesthesia: spinal. Postpartum course has been uncomplicated. Baby's course has been normal. Baby is feeding by breast. Bleeding no bleeding. Bowel function is normal. Bladder function is normal. Patient is not sexually active. Contraception method is abstinence. Postpartum depression screening: negative.  The following portions of the patient's history were reviewed and updated as appropriate: allergies, current medications, past family history, past medical history, past social history, past surgical history and problem list.  Review of Systems A comprehensive review of systems was negative.   Objective:    BP 127/66  Pulse 72  Wt 172 lb 6.4 oz (78.2 kg)  Breastfeeding? Yes  General:  alert and no distress           Abdomen: soft, non-tender; bowel sounds normal; no masses,  no organomegaly and incision: clean, dry and intact                           Assessment:     5 week postpartum exam. Pap smear not done at today's visit.   Plan:    1. Contraception: none (pts husband has been in [place])  2. Follow up in: 1 year or as needed.

## 2014-03-04 ENCOUNTER — Ambulatory Visit: Payer: Medicaid Other | Admitting: Oncology

## 2014-03-04 ENCOUNTER — Other Ambulatory Visit: Payer: Medicaid Other

## 2014-03-31 ENCOUNTER — Other Ambulatory Visit: Payer: Medicaid Other

## 2014-03-31 ENCOUNTER — Ambulatory Visit: Payer: Medicaid Other | Admitting: Oncology

## 2014-04-12 ENCOUNTER — Encounter: Payer: Self-pay | Admitting: Obstetrics & Gynecology

## 2014-04-26 ENCOUNTER — Ambulatory Visit (INDEPENDENT_AMBULATORY_CARE_PROVIDER_SITE_OTHER): Payer: Medicaid Other | Admitting: Infectious Diseases

## 2014-04-26 VITALS — Wt 175.0 lb

## 2014-04-26 DIAGNOSIS — B1910 Unspecified viral hepatitis B without hepatic coma: Secondary | ICD-10-CM

## 2014-04-26 DIAGNOSIS — Z23 Encounter for immunization: Secondary | ICD-10-CM

## 2014-04-26 DIAGNOSIS — B169 Acute hepatitis B without delta-agent and without hepatic coma: Secondary | ICD-10-CM

## 2014-04-26 DIAGNOSIS — H0011 Chalazion right upper eyelid: Secondary | ICD-10-CM

## 2014-04-26 NOTE — Assessment & Plan Note (Signed)
Suggest she use warm compresses. Anbx not indicated. She states she is going to see ophtho for eval.

## 2014-04-26 NOTE — Assessment & Plan Note (Addendum)
Will repeat her Hep B e Ag, VL. By ASLD guidelines are somewhat equivocal- they do not mention treatment for VL < 1000 and ALT > 2x normal. Will hold on giving her further tx at this point and follow her labs yearly.  Flu shot today

## 2014-04-26 NOTE — Progress Notes (Signed)
   Subjective:    Patient ID: Leo Grosser, female    DOB: 1985/12/31, 28 y.o.   MRN: [id number]  HPI 28 yo F was dx with Hepatitis B 2012 when she immigrated here from [place].  She was eval at [facility] Feb 2015 for her Hep B. She had Hep B DNA <1000 and Hep B S Ag+ and Core Ab+. AST 41, ALT 91.  She had c-section 7-23.  Is off Hep B rx. Still taking MVI. Is breast feeding. Baby has Hep B serologies (-). Getting vax and Ig.     Review of Systems     Objective:   Physical Exam  Constitutional: She appears well-developed and well-nourished.  HENT:  Mouth/Throat: No oropharyngeal exudate.  Eyes: EOM are normal. Pupils are equal, round, and reactive to light.    Neck: Neck supple.  Cardiovascular: Normal rate, regular rhythm and normal heart sounds.   Pulmonary/Chest: Effort normal and breath sounds normal.  Abdominal: Soft. Bowel sounds are normal. She exhibits no distension. There is no tenderness.  Musculoskeletal: She exhibits no edema.  Lymphadenopathy:    She has no cervical adenopathy.          Assessment & Plan:

## 2015-12-26 ENCOUNTER — Encounter (HOSPITAL_COMMUNITY): Payer: Self-pay | Admitting: Emergency Medicine

## 2015-12-26 ENCOUNTER — Emergency Department (HOSPITAL_COMMUNITY)
Admission: EM | Admit: 2015-12-26 | Discharge: 2015-12-26 | Disposition: A | Discharge status: Home / Self Care | Payer: No Typology Code available for payment source | Attending: Emergency Medicine | Admitting: Emergency Medicine

## 2015-12-26 ENCOUNTER — Emergency Department (HOSPITAL_COMMUNITY): Payer: No Typology Code available for payment source

## 2015-12-26 DIAGNOSIS — Y92410 Unspecified street and highway as the place of occurrence of the external cause: Secondary | ICD-10-CM | POA: Insufficient documentation

## 2015-12-26 DIAGNOSIS — M546 Pain in thoracic spine: Secondary | ICD-10-CM | POA: Diagnosis present

## 2015-12-26 DIAGNOSIS — Y999 Unspecified external cause status: Secondary | ICD-10-CM | POA: Insufficient documentation

## 2015-12-26 DIAGNOSIS — Y9389 Activity, other specified: Secondary | ICD-10-CM | POA: Diagnosis not present

## 2015-12-26 DIAGNOSIS — M62830 Muscle spasm of back: Secondary | ICD-10-CM | POA: Diagnosis not present

## 2015-12-26 MED ORDER — NAPROXEN 500 MG PO TABS: 500.0000 mg | ORAL_TABLET | Freq: Two times a day (BID) | ORAL | Status: DC | PRN

## 2015-12-26 MED ORDER — IBUPROFEN 400 MG PO TABS
400.0000 mg | ORAL_TABLET | Freq: Once | ORAL | Status: AC
  Administered 2015-12-26: 400 mg via ORAL
  Filled 2015-12-26: qty 1

## 2015-12-26 MED ORDER — CYCLOBENZAPRINE HCL 10 MG PO TABS: 10.0000 mg | ORAL_TABLET | Freq: Three times a day (TID) | ORAL | Status: DC | PRN
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
## 2015-12-26 NOTE — ED Notes (Signed)
Declined W/C at D/C and was escorted to lobby by RN.Declined W/C at D/C and was escorted to lobby by RN. 

## 2017-06-07 ENCOUNTER — Inpatient Hospital Stay (HOSPITAL_COMMUNITY)
Admission: AD | Admit: 2017-06-07 | Discharge: 2017-06-07 | Disposition: A | Discharge status: Home / Self Care | Payer: Self-pay | Source: Ambulatory Visit | Attending: Family Medicine | Admitting: Family Medicine

## 2017-06-07 ENCOUNTER — Encounter (HOSPITAL_COMMUNITY): Payer: Self-pay | Admitting: *Deleted

## 2017-06-07 DIAGNOSIS — N760 Acute vaginitis: Secondary | ICD-10-CM | POA: Insufficient documentation

## 2017-06-07 DIAGNOSIS — R102 Pelvic and perineal pain: Secondary | ICD-10-CM

## 2017-06-07 DIAGNOSIS — B9689 Other specified bacterial agents as the cause of diseases classified elsewhere: Secondary | ICD-10-CM | POA: Insufficient documentation

## 2017-06-07 DIAGNOSIS — R1032 Left lower quadrant pain: Secondary | ICD-10-CM | POA: Insufficient documentation

## 2017-06-07 DIAGNOSIS — R1031 Right lower quadrant pain: Secondary | ICD-10-CM | POA: Insufficient documentation

## 2017-06-07 LAB — WET PREP, GENITAL
Sperm: NONE SEEN
Trich, Wet Prep: NONE SEEN
YEAST WET PREP: NONE SEEN

## 2017-06-07 LAB — URINALYSIS, ROUTINE W REFLEX MICROSCOPIC
Bilirubin Urine: NEGATIVE
Glucose, UA: NEGATIVE mg/dL
Hgb urine dipstick: NEGATIVE
KETONES UR: NEGATIVE mg/dL
Leukocytes, UA: NEGATIVE
NITRITE: NEGATIVE
PH: 5 (ref 5.0–8.0)
PROTEIN: NEGATIVE mg/dL
Specific Gravity, Urine: 1.015 (ref 1.005–1.030)

## 2017-06-07 LAB — POCT PREGNANCY, URINE: Preg Test, Ur: NEGATIVE

## 2017-06-07 MED ORDER — METRONIDAZOLE 500 MG PO TABS: 500.0000 mg | ORAL_TABLET | Freq: Two times a day (BID) | ORAL | 0 refills | Status: AC

## 2017-06-07 NOTE — MAU Provider Note (Signed)
History  CSN: [id number] Arrival date and time: 06/07/17 8938  First Provider Initiated Contact with Patient 06/07/17 0456      Chief Complaint  Patient presents with  . Abdominal Pain    HPI: Andrea Serrano is a 31 y.o. [id number] who presents to maternity admissions reporting bilateral lower abdominal pain for 3 days. Reports taking Tylenol around 11:30pm last nigh and this has improved pain, but decided to come in to get it checked out denies. Currently rates pain 0/10. Denies any associated symptoms, including no N/V/D, abnormal vaginal discharge, dysuria, hematuria, urinary frequency, fever or chills.  LMP 05/25/17. She is sexually active. Not on birth control.  OB History  Gravida Para Term Preterm AB Living  3 1 1   2 1   SAB TAB Ectopic Multiple Live Births    2     1    # Outcome Date GA Lbr Len/2nd Weight Sex Delivery Anes PTL Lv  3 Term 12/31/13 [redacted]w[redacted]d  7 lb 11.1 oz (3.491 kg) M CS-Vac EPI  LIV  2 TAB 2010          1 TAB 2002             Past Medical History:  Diagnosis Date  . Constipation   . Hepatitis B   . Thrombocytopenia affecting pregnancy, antepartum [facility])    Past Surgical History:  Procedure Laterality Date  . CESAREAN SECTION N/A 12/31/2013   Procedure: Primary Cesarean Section with Delivery Baby Boy @ 0157,  Apgars 8/9;  Surgeon: Florian Buff, MD;  Location: [place] [facility];  Service: Obstetrics;  Laterality: N/A;  . TOOTH EXTRACTION     Social History   Socioeconomic History  . Marital status: Married    Spouse name: Not on file  . Number of children: Not on file  . Years of education: Not on file  . Highest education level: Not on file  Social Needs  . Financial resource strain: Not on file  . Food insecurity - worry: Not on file  . Food insecurity - inability: Not on file  . Transportation needs - medical: Not on file  . Transportation needs - non-medical: Not on file  Occupational History  . Not on file  Tobacco Use  . Smoking status: Never Smoker   . Smokeless tobacco: Never Used  Substance and Sexual Activity  . Alcohol use: No  . Drug use: No  . Sexual activity: Yes    Partners: Male    Birth control/protection: None  Other Topics Concern  . Not on file  Social History Narrative  . Not on file   No Known Allergies  Medications Prior to Admission  Medication Sig Dispense Refill Last Dose  . cyclobenzaprine (FLEXERIL) 10 MG tablet Take 1 tablet (10 mg total) by mouth 3 (three) times daily as needed for muscle spasms. 15 tablet 0 More than a month at Unknown time  . naproxen (NAPROSYN) 500 MG tablet Take 1 tablet (500 mg total) by mouth 2 (two) times daily as needed for mild pain, moderate pain or headache (TAKE WITH MEALS.). 20 tablet 0 More than a month at Unknown time  . Prenatal Multivit-Min-Fe-FA (PRENATAL VITAMINS) 0.8 MG tablet Take 1 tablet by mouth daily. 30 tablet 12 More than a month at Unknown time    I have reviewed patient's Past Medical Hx, Surgical Hx, Family Hx, Social Hx, medications and allergies.   Review of Systems: Negative except for what is mentioned in HPI.  Physical Exam  Blood pressure 124/76, pulse 80, temperature (!) 97.5 F (36.4 C), temperature source Oral, resp. rate 18, height 5\' 3"  (1.6 m), weight 167 lb 12 oz (76.1 kg), last menstrual period 05/25/2017, currently breastfeeding.  Constitutional: Well-developed, well-nourished female in no acute distress.  HENT: [place]/AT, normal oropharynx mucosa. MMM Eyes: normal conjunctivae, no scleral icterus Cardiovascular: normal rate, regular rhythm Respiratory: normal effort, lungs CTAB.  GI: Abd soft,  non-distended, normoactive bowel sounds. Mild mid-lower abdominal tenderness to palpation.  Pelvic: NEFG. Normal vaginal mucosa without lesions; cervix pink, visually closed, without lesion; scant discharge with fishy odor. Mild discomfort with exam. No prominent uterine tenderness, adnexal tenderness or CMT. No palpable masses.  MSK: Extremities  nontender, no edema Neurologic: Alert and oriented x 4. Psych: Normal mood and affect Skin: warm and dry    MAU Course/MDM:   Nursing notes and VS reviewed. Patient seen and examined, as noted above.  Pt currently w/o pain. Mild TTP on exam. Neg UPT. Pelvic cultures collected.   Wet prep c/w BV  Assessment and Plan  Assessment: 1. BV (bacterial vaginosis)   2. Pelvic pain     Plan: --Flagyl 500 mg BID x 7 day --Discharge home in stable condition --Return precautions   Degele, Jenne Pane, MD 06/07/2017 6:30 AM

## 2017-06-07 NOTE — MAU Note (Signed)
PT SAYS SHE HAS ABD PAIN ON BOTH SIDES- STARTED   Tuesday.  NEVER HAPPENED  BEFORE .  TOOK TYLENOL XS- 2 TABS - TONIGHT AT 7564-   FEEL BETTER.   NO BIRTH CONTROL.       LAST SEX- 12-25

## 2017-06-07 NOTE — Discharge Instructions (Signed)

## 2017-06-10 LAB — GC/CHLAMYDIA PROBE AMP (~~LOC~~) NOT AT ARMC
Chlamydia: NEGATIVE
Neisseria Gonorrhea: NEGATIVE

## 2017-12-18 ENCOUNTER — Encounter (HOSPITAL_COMMUNITY): Payer: Self-pay | Admitting: *Deleted

## 2017-12-18 ENCOUNTER — Inpatient Hospital Stay (HOSPITAL_COMMUNITY): Payer: Managed Care, Other (non HMO)

## 2017-12-18 ENCOUNTER — Inpatient Hospital Stay (HOSPITAL_COMMUNITY)
Admission: AD | Admit: 2017-12-18 | Discharge: 2017-12-18 | Disposition: A | Discharge status: Home / Self Care | Payer: Managed Care, Other (non HMO) | Source: Ambulatory Visit | Attending: Obstetrics and Gynecology | Admitting: Obstetrics and Gynecology

## 2017-12-18 DIAGNOSIS — Z3A11 11 weeks gestation of pregnancy: Secondary | ICD-10-CM

## 2017-12-18 DIAGNOSIS — R102 Pelvic and perineal pain: Secondary | ICD-10-CM

## 2017-12-18 DIAGNOSIS — O26892 Other specified pregnancy related conditions, second trimester: Secondary | ICD-10-CM | POA: Insufficient documentation

## 2017-12-18 DIAGNOSIS — O99112 Other diseases of the blood and blood-forming organs and certain disorders involving the immune mechanism complicating pregnancy, second trimester: Secondary | ICD-10-CM | POA: Diagnosis not present

## 2017-12-18 DIAGNOSIS — Z8249 Family history of ischemic heart disease and other diseases of the circulatory system: Secondary | ICD-10-CM | POA: Insufficient documentation

## 2017-12-18 DIAGNOSIS — O99119 Other diseases of the blood and blood-forming organs and certain disorders involving the immune mechanism complicating pregnancy, unspecified trimester: Secondary | ICD-10-CM

## 2017-12-18 DIAGNOSIS — Z3491 Encounter for supervision of normal pregnancy, unspecified, first trimester: Secondary | ICD-10-CM

## 2017-12-18 DIAGNOSIS — Z9889 Other specified postprocedural states: Secondary | ICD-10-CM | POA: Diagnosis not present

## 2017-12-18 DIAGNOSIS — O26899 Other specified pregnancy related conditions, unspecified trimester: Secondary | ICD-10-CM

## 2017-12-18 DIAGNOSIS — O26891 Other specified pregnancy related conditions, first trimester: Secondary | ICD-10-CM

## 2017-12-18 DIAGNOSIS — R109 Unspecified abdominal pain: Secondary | ICD-10-CM | POA: Diagnosis present

## 2017-12-18 DIAGNOSIS — D696 Thrombocytopenia, unspecified: Secondary | ICD-10-CM | POA: Insufficient documentation

## 2017-12-18 LAB — WET PREP, GENITAL
Clue Cells Wet Prep HPF POC: NONE SEEN
Sperm: NONE SEEN
Trich, Wet Prep: NONE SEEN
Yeast Wet Prep HPF POC: NONE SEEN

## 2017-12-18 LAB — CBC
HCT: 35.6 % — ABNORMAL LOW (ref 36.0–46.0)
Hemoglobin: 11.8 g/dL — ABNORMAL LOW (ref 12.0–15.0)
MCH: 26.2 pg (ref 26.0–34.0)
MCHC: 33.1 g/dL (ref 30.0–36.0)
MCV: 78.9 fL (ref 78.0–100.0)
PLATELETS: 97 10*3/uL — AB (ref 150–400)
RBC: 4.51 MIL/uL (ref 3.87–5.11)
RDW: 14.5 % (ref 11.5–15.5)
WBC: 5.7 10*3/uL (ref 4.0–10.5)

## 2017-12-18 LAB — URINALYSIS, ROUTINE W REFLEX MICROSCOPIC
Bilirubin Urine: NEGATIVE
Glucose, UA: NEGATIVE mg/dL
Hgb urine dipstick: NEGATIVE
Ketones, ur: NEGATIVE mg/dL
Leukocytes, UA: NEGATIVE
Nitrite: NEGATIVE
Protein, ur: NEGATIVE mg/dL
Specific Gravity, Urine: 1.02 (ref 1.005–1.030)
pH: 5 (ref 5.0–8.0)

## 2017-12-18 LAB — HCG, QUANTITATIVE, PREGNANCY: hCG, Beta Chain, Quant, S: 76073 m[IU]/mL — ABNORMAL HIGH (ref <5)

## 2017-12-18 LAB — POCT PREGNANCY, URINE: Preg Test, Ur: POSITIVE — AB

## 2017-12-18 NOTE — MAU Provider Note (Addendum)
History     CSN: [id number]  Arrival date and time: 12/18/17 3151   First Provider Initiated Contact with Patient 12/18/17 [phone]      Chief Complaint  Patient presents with  . Possible Pregnancy  . Abdominal Pain   G3P1021 @11 .2 wks here with LAP and pelvic pain.  Pain started yesterday, located LLQ and into pelvis. Pain has improved since last night. No fevers. No GI sx. No urinanry sx. No VB or discharge. No concerns for STDs or new sexual partner.   OB History    Gravida  4   Para  1   Term  1   Preterm      AB  2   Living  1     SAB      TAB  2   Ectopic      Multiple      Live Births  1           Past Medical History:  Diagnosis Date  . Constipation   . Hepatitis B   . Thrombocytopenia affecting pregnancy, antepartum [facility])     Past Surgical History:  Procedure Laterality Date  . CESAREAN SECTION N/A 12/31/2013   Procedure: Primary Cesarean Section with Delivery Baby Boy @ 0157,  Apgars 8/9;  Surgeon: Florian Buff, MD;  Location: [place] [facility];  Service: Obstetrics;  Laterality: N/A;  . TOOTH EXTRACTION      Family History  Problem Relation Age of Onset  . Hypertension Mother   . Hypertension Father   . Heart disease Father     Social History   Tobacco Use  . Smoking status: Never Smoker  . Smokeless tobacco: Never Used  Substance Use Topics  . Alcohol use: No  . Drug use: No    Allergies: No Known Allergies  No medications prior to admission.    Review of Systems  Constitutional: Negative for fever.  Gastrointestinal: Positive for abdominal pain. Negative for constipation, diarrhea, nausea and vomiting.  Genitourinary: Negative for dysuria, frequency, urgency, vaginal bleeding and vaginal discharge.   Physical Exam   Blood pressure 136/81, pulse 69, temperature 98.5 F (36.9 C), temperature source Oral, resp. rate 18, height 5\' 3"  (1.6 m), weight 169 lb (76.7 kg), last menstrual period 09/30/2017, SpO2 100 %, currently  breastfeeding.  Physical Exam  Constitutional: She is oriented to person, place, and time. She appears well-developed and well-nourished. No distress.  HENT:  Head: Normocephalic and atraumatic.  Neck: Normal range of motion.  Cardiovascular: Normal rate.  Respiratory: Effort normal. No respiratory distress.  GI: Soft. She exhibits no distension and no mass. There is tenderness (LLQ, SP). There is no rebound and no guarding.  Genitourinary:  Genitourinary Comments: External: no lesions or erythema Vagina: rugated, pink, moist, thin white discharge Uterus: ++ enlarged, anteverted, non tender, no CMT Adnexae: no masses, no tenderness left, no tenderness right Cervix closed, nml   Musculoskeletal: Normal range of motion.  Neurological: She is alert and oriented to person, place, and time.  Skin: Skin is warm and dry.  Psychiatric: She has a normal mood and affect.   Results for orders placed or performed during the hospital encounter of 12/18/17 (from the past 24 hour(s))  Urinalysis, Routine w reflex microscopic     Status: None   Collection Time: 12/18/17  7:25 AM  Result Value Ref Range   Color, Urine YELLOW YELLOW   APPearance CLEAR CLEAR   Specific Gravity, Urine 1.020 1.005 - 1.030   pH  5.0 5.0 - 8.0   Glucose, UA NEGATIVE NEGATIVE mg/dL   Hgb urine dipstick NEGATIVE NEGATIVE   Bilirubin Urine NEGATIVE NEGATIVE   Ketones, ur NEGATIVE NEGATIVE mg/dL   Protein, ur NEGATIVE NEGATIVE mg/dL   Nitrite NEGATIVE NEGATIVE   Leukocytes, UA NEGATIVE NEGATIVE  Pregnancy, urine POC     Status: Abnormal   Collection Time: 12/18/17  7:40 AM  Result Value Ref Range   Preg Test, Ur POSITIVE (A) NEGATIVE  CBC     Status: Abnormal   Collection Time: 12/18/17  8:36 AM  Result Value Ref Range   WBC 5.7 4.0 - 10.5 K/uL   RBC 4.51 3.87 - 5.11 MIL/uL   Hemoglobin 11.8 (L) 12.0 - 15.0 g/dL   HCT 35.6 (L) 36.0 - 46.0 %   MCV 78.9 78.0 - 100.0 fL   MCH 26.2 26.0 - 34.0 pg   MCHC 33.1 30.0 -  36.0 g/dL   RDW 14.5 11.5 - 15.5 %   Platelets 97 (L) 150 - 400 K/uL  hCG, quantitative, pregnancy     Status: Abnormal   Collection Time: 12/18/17  8:36 AM  Result Value Ref Range   hCG, Beta Chain, Quant, S 76,073 (H) <5 mIU/mL  Wet prep, genital     Status: Abnormal   Collection Time: 12/18/17  8:47 AM  Result Value Ref Range   Yeast Wet Prep HPF POC NONE SEEN NONE SEEN   Trich, Wet Prep NONE SEEN NONE SEEN   Clue Cells Wet Prep HPF POC NONE SEEN NONE SEEN   WBC, Wet Prep HPF POC MODERATE (A) NONE SEEN   Sperm NONE SEEN    US Ob Comp Less 14 Wks  Result Date: 12/18/2017 CLINICAL DATA:  Pregnant, pain EXAM: OBSTETRIC <14 WK ULTRASOUND TECHNIQUE: Transabdominal ultrasound was performed for evaluation of the gestation as well as the maternal uterus and adnexal regions. COMPARISON:  None. FINDINGS: Intrauterine gestational sac: Single Yolk sac:  Not Visualized. Embryo:  Visualized. Cardiac Activity: Visualized. Heart Rate: 159 bpm CRL:   41.1 mm   11 w 0 d                  [place] EDC: 07/09/2018 Subchorionic hemorrhage:  None visualized. Maternal uterus/adnexae: Multiple uterine fibroids, measuring up to 1.8 cm in the posterior uterine body. Bilateral ovaries are within normal limits, noting a right corpus luteal cyst. No free fluid. IMPRESSION: Single live intrauterine gestation, with estimated gestational age [redacted] weeks 0 days by crown-rump length, as above. Electronically Signed   By: Julian Hy M.D.   On: 12/18/2017 09:51   MAU Course  Procedures   Labs and transvaginal ultrasound were ordered.   MDM Labs and [place] ordered and reviewed. Normal IUP on [place]. No evidence of acute abdominal or pelvic process. Pain likely physiologic to early pregnancy. Thrombocytopenia identified-will need Hematology consult once in care. Discussed findings with pt. Stable for discharge home.   Assessment and Plan   1. [redacted] weeks gestation of pregnancy   2. Pelvic pain affecting pregnancy   3.  Thrombocytopenia during pregnancy ([place])   4. Normal intrauterine pregnancy on prenatal ultrasound in first trimester    Discharge home Follow up with OB provider of choice-plans WOC Start PNV SAB/return precautions  Allergies as of 12/18/2017   No Known Allergies     Medication List    STOP taking these medications   cyclobenzaprine 10 MG tablet Commonly known as:  FLEXERIL   naproxen 500 MG tablet Commonly  known as:  NAPROSYN     TAKE these medications   Prenatal Vitamins 0.8 MG tablet Take 1 tablet by mouth daily.      Julianne Handler, CNM 12/18/2017, 10:44 AM

## 2017-12-18 NOTE — MAU Note (Signed)
Pt reports pain and heaviness between her legs all day yesterday, pain off/on in her lower abd. No pain today. LMP 09/30/2017, positive preg test at home.

## 2017-12-18 NOTE — MAU Provider Note (Addendum)
History     CSN: [id number]  Arrival date and time: 12/18/17 6387   First Provider Initiated Contact with Patient 12/18/17 [phone]      Chief Complaint  Patient presents with  . Possible Pregnancy  . Abdominal Pain   Patient is a 32 year old G87P1021 female that presents for pain in her lower abdomen and pelvis that began yesterday. She reports that her LMP was on 09/30/17 and that she had a positive home pregnancy test, which prompted her to come in to be seen. She says that her pain is on the left lower side of her abdomen and diffusely throughout her pelvis. She states that her pain has improved since last night and that she is currently pain free. She has no concern for any STDs and denies any new sexual partners.    Possible Pregnancy  Associated symptoms include abdominal pain. Pertinent negatives include no nausea or vomiting.  Abdominal Pain  The primary symptoms of the illness include abdominal pain. The primary symptoms of the illness do not include nausea, vomiting, diarrhea, dysuria, vaginal discharge or vaginal bleeding.  Symptoms associated with the illness do not include constipation.   Past Medical History:  Diagnosis Date  . Constipation   . Hepatitis B   . Thrombocytopenia affecting pregnancy, antepartum [facility])     Past Surgical History:  Procedure Laterality Date  . CESAREAN SECTION N/A 12/31/2013   Procedure: Primary Cesarean Section with Delivery Baby Boy @ 0157,  Apgars 8/9;  Surgeon: Florian Buff, MD;  Location: [place] [facility];  Service: Obstetrics;  Laterality: N/A;  . TOOTH EXTRACTION      Family History  Problem Relation Age of Onset  . Hypertension Mother   . Hypertension Father   . Heart disease Father     Social History   Tobacco Use  . Smoking status: Never Smoker  . Smokeless tobacco: Never Used  Substance Use Topics  . Alcohol use: No  . Drug use: No    Allergies: No Known Allergies  Medications Prior to Admission  Medication Sig Dispense  Refill Last Dose  . cyclobenzaprine (FLEXERIL) 10 MG tablet Take 1 tablet (10 mg total) by mouth 3 (three) times daily as needed for muscle spasms. 15 tablet 0 More than a month at Unknown time  . naproxen (NAPROSYN) 500 MG tablet Take 1 tablet (500 mg total) by mouth 2 (two) times daily as needed for mild pain, moderate pain or headache (TAKE WITH MEALS.). 20 tablet 0 More than a month at Unknown time  . Prenatal Multivit-Min-Fe-FA (PRENATAL VITAMINS) 0.8 MG tablet Take 1 tablet by mouth daily. 30 tablet 12 More than a month at Unknown time    Review of Systems  Constitutional: Positive for appetite change.       Increased appetite.   Gastrointestinal: Positive for abdominal pain. Negative for constipation, diarrhea, nausea and vomiting.       Generalized pain in the lower left abdomen.  Genitourinary: Positive for pelvic pain. Negative for dysuria, vaginal bleeding, vaginal discharge and vaginal pain.   Physical Exam   Blood pressure 118/78, pulse 64, temperature 97.8 F (36.6 C), temperature source Oral, resp. rate 16, height 5\' 3"  (1.6 m), weight 169 lb (76.7 kg), last menstrual period 09/30/2017, SpO2 100 %, currently breastfeeding.  Physical Exam  Constitutional: She is oriented to person, place, and time. She appears well-developed and well-nourished. No distress.  HENT:  Head: Normocephalic and atraumatic.  Cardiovascular: Normal rate, regular rhythm and normal heart sounds.  Respiratory: Effort normal and breath sounds normal.  GI: Soft. There is tenderness.  Tenderness to palpation in the left lower quadrant.   Genitourinary: Vagina normal.  Neurological: She is alert and oriented to person, place, and time.  Skin: Skin is warm and dry.  Psychiatric: She has a normal mood and affect. Her behavior is normal.    MAU Course  Procedures  Labs and an ultrasound were performed today.   MDM 32 year old G74P1021 female with a chief complaint of abdominal and pelvic pain since  last night. Patient had a positive urine pregnancy test today and emergent causes for her abdominal and pelvic pain were ruled out.    Assessment and Plan   1. Normal pregnancy  2. Physiologic abdominal pain, pregnancy related 3. Physiologic pelvic pain, pregnancy related  4. Thrombocytopenia  5. Hepatitis B  The patient was instructed to follow up with her OBGYN for further prenatal care and her hematologist in regard to her thrombocytopenia. She was advised to start taking a prenatal vitamin and to return to the MAU if her pain worsens. She was discharged home.     Liborio Nixon 12/18/2017, 8:52 AM

## 2017-12-18 NOTE — Discharge Instructions (Signed)
First Trimester of Pregnancy The first trimester of pregnancy is from week 1 until the end of week 13 (months 1 through 3). During this time, your baby will begin to develop inside you. At 6-8 weeks, the eyes and face are formed, and the heartbeat can be seen on ultrasound. At the end of 12 weeks, all the baby's organs are formed. Prenatal care is all the medical care you receive before the birth of your baby. Make sure you get good prenatal care and follow all of your doctor's instructions. Follow these instructions at home: Medicines  Take over-the-counter and prescription medicines only as told by your doctor. Some medicines are safe and some medicines are not safe during pregnancy.  Take a prenatal vitamin that contains at least 600 micrograms (mcg) of folic acid.  If you have trouble pooping (constipation), take medicine that will make your stool soft (stool softener) if your doctor approves. Eating and drinking  Eat regular, healthy meals.  Your doctor will tell you the amount of weight gain that is right for you.  Avoid raw meat and uncooked cheese.  If you feel sick to your stomach (nauseous) or throw up (vomit): ? Eat 4 or 5 small meals a day instead of 3 large meals. ? Try eating a few soda crackers. ? Drink liquids between meals instead of during meals.  To prevent constipation: ? Eat foods that are high in fiber, like fresh fruits and vegetables, whole grains, and beans. ? Drink enough fluids to keep your pee (urine) clear or pale yellow. Activity  Exercise only as told by your doctor. Stop exercising if you have cramps or pain in your lower belly (abdomen) or low back.  Do not exercise if it is too hot, too humid, or if you are in a place of great height (high altitude).  Try to avoid standing for long periods of time. Move your legs often if you must stand in one place for a long time.  Avoid heavy lifting.  Wear low-heeled shoes. Sit and stand up straight.  You  can have sex unless your doctor tells you not to. Relieving pain and discomfort  Wear a good support bra if your breasts are sore.  Take warm water baths (sitz baths) to soothe pain or discomfort caused by hemorrhoids. Use hemorrhoid cream if your doctor says it is okay.  Rest with your legs raised if you have leg cramps or low back pain.  If you have puffy, bulging veins (varicose veins) in your legs: ? Wear support hose or compression stockings as told by your doctor. ? Raise (elevate) your feet for 15 minutes, 3-4 times a day. ? Limit salt in your food. Prenatal care  Schedule your prenatal visits by the twelfth week of pregnancy.  Write down your questions. Take them to your prenatal visits.  Keep all your prenatal visits as told by your doctor. This is important. Safety  Wear your seat belt at all times when driving.  Make a list of emergency phone numbers. The list should include numbers for family, friends, the hospital, and police and fire departments. General instructions  Ask your doctor for a referral to a local prenatal class. Begin classes no later than at the start of month 6 of your pregnancy.  Ask for help if you need counseling or if you need help with nutrition. Your doctor can give you advice or tell you where to go for help.  Do not use hot tubs, steam rooms, or  saunas.  Do not douche or use tampons or scented sanitary pads.  Do not cross your legs for long periods of time.  Avoid all herbs and alcohol. Avoid drugs that are not approved by your doctor.  Do not use any tobacco products, including cigarettes, chewing tobacco, and electronic cigarettes. If you need help quitting, ask your doctor. You may get counseling or other support to help you quit.  Avoid cat litter boxes and soil used by cats. These carry germs that can cause birth defects in the baby and can cause a loss of your baby (miscarriage) or stillbirth.  Visit your dentist. At home, brush  your teeth with a soft toothbrush. Be gentle when you floss. Contact a doctor if:  You are dizzy.  You have mild cramps or pressure in your lower belly.  You have a nagging pain in your belly area.  You continue to feel sick to your stomach, you throw up, or you have watery poop (diarrhea).  You have a bad smelling fluid coming from your vagina.  You have pain when you pee (urinate).  You have increased puffiness (swelling) in your face, hands, legs, or ankles. Get help right away if:  You have a fever.  You are leaking fluid from your vagina.  You have spotting or bleeding from your vagina.  You have very bad belly cramping or pain.  You gain or lose weight rapidly.  You throw up blood. It may look like coffee grounds.  You are around people who have [place] measles, fifth disease, or chickenpox.  You have a very bad headache.  You have shortness of breath.  You have any kind of trauma, such as from a fall or a car accident. Summary  The first trimester of pregnancy is from week 1 until the end of week 13 (months 1 through 3).  To take care of yourself and your unborn baby, you will need to eat healthy meals, take medicines only if your doctor tells you to do so, and do activities that are safe for you and your baby.  Keep all follow-up visits as told by your doctor. This is important as your doctor will have to ensure that your baby is healthy and growing well. This information is not intended to replace advice given to you by your health care provider. Make sure you discuss any questions you have with your health care provider. Document Released: 11/14/2007 Document Revised: 06/05/2016 Document Reviewed: 06/05/2016 Elsevier Interactive Patient Education  2017 [employer] Abdominal pain is common in pregnancy. Most of the time, it does not cause harm. There are many causes of abdominal pain. Some causes are more serious than others and sometimes the cause is not  known. Abdominal pain can be a sign that something is very wrong with the pregnancy or the pain may have nothing to do with the pregnancy. Always tell your health care provider if you have any abdominal pain. Follow these instructions at home:  Do not have sex or put anything in your vagina until your symptoms go away completely.  Watch your abdominal pain for any changes.  Get plenty of rest until your pain improves.  Drink enough fluid to keep your urine clear or pale yellow.  Take over-the-counter or prescription medicines only as told by your health care provider.  Keep all follow-up visits as told by your health care provider. This is important. Contact a health care provider if:  You have a fever.  Your pain gets  worse or you have cramping.  Your pain continues after resting. Get help right away if:  You are bleeding, leaking fluid, or passing tissue from the vagina.  You have vomiting or diarrhea that does not go away.  You have painful or bloody urination.  You notice a decrease in your baby's movements.  You feel very weak or faint.  You have shortness of breath.  You develop a severe headache with abdominal pain.  You have abnormal vaginal discharge with abdominal pain. This information is not intended to replace advice given to you by your health care provider. Make sure you discuss any questions you have with your health care provider. Document Released: 05/28/2005 Document Revised: 03/08/2016 Document Reviewed: 12/25/2012 Elsevier Interactive Patient Education  [employer].

## 2017-12-19 LAB — GC/CHLAMYDIA PROBE AMP (~~LOC~~) NOT AT ARMC
Chlamydia: NEGATIVE
Neisseria Gonorrhea: NEGATIVE

## 2018-01-01 ENCOUNTER — Ambulatory Visit (INDEPENDENT_AMBULATORY_CARE_PROVIDER_SITE_OTHER): Payer: Managed Care, Other (non HMO) | Admitting: Obstetrics and Gynecology

## 2018-01-01 ENCOUNTER — Encounter: Payer: Self-pay | Admitting: Obstetrics and Gynecology

## 2018-01-01 ENCOUNTER — Ambulatory Visit: Payer: Managed Care, Other (non HMO) | Admitting: Clinical

## 2018-01-01 ENCOUNTER — Telehealth: Payer: Self-pay

## 2018-01-01 VITALS — BP 117/76 | HR 76 | Wt 172.8 lb

## 2018-01-01 DIAGNOSIS — Z3483 Encounter for supervision of other normal pregnancy, third trimester: Secondary | ICD-10-CM | POA: Insufficient documentation

## 2018-01-01 DIAGNOSIS — B181 Chronic viral hepatitis B without delta-agent: Secondary | ICD-10-CM

## 2018-01-01 DIAGNOSIS — D696 Thrombocytopenia, unspecified: Secondary | ICD-10-CM

## 2018-01-01 DIAGNOSIS — Z1151 Encounter for screening for human papillomavirus (HPV): Secondary | ICD-10-CM

## 2018-01-01 DIAGNOSIS — Z113 Encounter for screening for infections with a predominantly sexual mode of transmission: Secondary | ICD-10-CM | POA: Diagnosis not present

## 2018-01-01 DIAGNOSIS — O219 Vomiting of pregnancy, unspecified: Secondary | ICD-10-CM

## 2018-01-01 DIAGNOSIS — O98411 Viral hepatitis complicating pregnancy, first trimester: Secondary | ICD-10-CM

## 2018-01-01 DIAGNOSIS — Z124 Encounter for screening for malignant neoplasm of cervix: Secondary | ICD-10-CM

## 2018-01-01 DIAGNOSIS — Z98891 History of uterine scar from previous surgery: Secondary | ICD-10-CM

## 2018-01-01 DIAGNOSIS — O98419 Viral hepatitis complicating pregnancy, unspecified trimester: Principal | ICD-10-CM

## 2018-01-01 DIAGNOSIS — Z3402 Encounter for supervision of normal first pregnancy, second trimester: Secondary | ICD-10-CM

## 2018-01-01 MED ORDER — ONDANSETRON 4 MG PO TBDP: 4.0000 mg | ORAL_TABLET | Freq: Four times a day (QID) | ORAL | 1 refills | Status: DC | PRN

## 2018-01-01 NOTE — Addendum Note (Signed)
Addended by: Bethanne Ginger on: 01/01/2018 04:14 PM   Modules accepted: Orders

## 2018-01-01 NOTE — Telephone Encounter (Signed)
Called pt to advise of [place] appt on 02/11/18 @ 10:30am. Pt verbalized understanding.

## 2018-01-01 NOTE — Patient Instructions (Signed)
First Trimester of Pregnancy The first trimester of pregnancy is from week 1 until the end of week 13 (months 1 through 3). A week after a sperm fertilizes an egg, the egg will implant on the wall of the uterus. This embryo will begin to develop into a baby. Genes from you and your partner will form the baby. The female genes will determine whether the baby will be a boy or a girl. At 6-8 weeks, the eyes and face will be formed, and the heartbeat can be seen on ultrasound. At the end of 12 weeks, all the baby's organs will be formed. Now that you are pregnant, you will want to do everything you can to have a healthy baby. Two of the most important things are to get good prenatal care and to follow your health care provider's instructions. Prenatal care is all the medical care you receive before the baby's birth. This care will help prevent, find, and treat any problems during the pregnancy and childbirth. Body changes during your first trimester Your body goes through many changes during pregnancy. The changes vary from woman to woman.  You may gain or lose a couple of pounds at first.  You may feel sick to your stomach (nauseous) and you may throw up (vomit). If the vomiting is uncontrollable, call your health care provider.  You may tire easily.  You may develop headaches that can be relieved by medicines. All medicines should be approved by your health care provider.  You may urinate more often. Painful urination may mean you have a bladder infection.  You may develop heartburn as a result of your pregnancy.  You may develop constipation because certain hormones are causing the muscles that push stool through your intestines to slow down.  You may develop hemorrhoids or swollen veins (varicose veins).  Your breasts may begin to grow larger and become tender. Your nipples may stick out more, and the tissue that surrounds them (areola) may become darker.  Your gums may bleed and may be  sensitive to brushing and flossing.  Dark spots or blotches (chloasma, mask of pregnancy) may develop on your face. This will likely fade after the baby is born.  Your menstrual periods will stop.  You may have a loss of appetite.  You may develop cravings for certain kinds of food.  You may have changes in your emotions from day to day, such as being excited to be pregnant or being concerned that something may go wrong with the pregnancy and baby.  You may have more vivid and strange dreams.  You may have changes in your hair. These can include thickening of your hair, rapid growth, and changes in texture. Some women also have hair loss during or after pregnancy, or hair that feels dry or thin. Your hair will most likely return to normal after your baby is born.  What to expect at prenatal visits During a routine prenatal visit:  You will be weighed to make sure you and the baby are growing normally.  Your blood pressure will be taken.  Your abdomen will be measured to track your baby's growth.  The fetal heartbeat will be listened to between weeks 10 and 14 of your pregnancy.  Test results from any previous visits will be discussed.  Your health care provider may ask you:  How you are feeling.  If you are feeling the baby move.  If you have had any abnormal symptoms, such as leaking fluid, bleeding, severe headaches,   or abdominal cramping.  If you are using any tobacco products, including cigarettes, chewing tobacco, and electronic cigarettes.  If you have any questions.  Other tests that may be performed during your first trimester include:  Blood tests to find your blood type and to check for the presence of any previous infections. The tests will also be used to check for low iron levels (anemia) and protein on red blood cells (Rh antibodies). Depending on your risk factors, or if you previously had diabetes during pregnancy, you may have tests to check for high blood  sugar that affects pregnant women (gestational diabetes).  Urine tests to check for infections, diabetes, or protein in the urine.  An ultrasound to confirm the proper growth and development of the baby.  Fetal screens for spinal cord problems (spina bifida) and Down syndrome.  HIV (human immunodeficiency virus) testing. Routine prenatal testing includes screening for HIV, unless you choose not to have this test.  You may need other tests to make sure you and the baby are doing well.  Follow these instructions at home: Medicines  Follow your health care provider's instructions regarding medicine use. Specific medicines may be either safe or unsafe to take during pregnancy.  Take a prenatal vitamin that contains at least 600 micrograms (mcg) of folic acid.  If you develop constipation, try taking a stool softener if your health care provider approves. Eating and drinking  Eat a balanced diet that includes fresh fruits and vegetables, whole grains, good sources of protein such as meat, eggs, or tofu, and low-fat dairy. Your health care provider will help you determine the amount of weight gain that is right for you.  Avoid raw meat and uncooked cheese. These carry germs that can cause birth defects in the baby.  Eating four or five small meals rather than three large meals a day may help relieve nausea and vomiting. If you start to feel nauseous, eating a few soda crackers can be helpful. Drinking liquids between meals, instead of during meals, also seems to help ease nausea and vomiting.  Limit foods that are high in fat and processed sugars, such as fried and sweet foods.  To prevent constipation: ? Eat foods that are high in fiber, such as fresh fruits and vegetables, whole grains, and beans. ? Drink enough fluid to keep your urine clear or pale yellow. Activity  Exercise only as directed by your health care provider. Most women can continue their usual exercise routine during  pregnancy. Try to exercise for 30 minutes at least 5 days a week. Exercising will help you: ? Control your weight. ? Stay in shape. ? Be prepared for labor and delivery.  Experiencing pain or cramping in the lower abdomen or lower back is a good sign that you should stop exercising. Check with your health care provider before continuing with normal exercises.  Try to avoid standing for long periods of time. Move your legs often if you must stand in one place for a long time.  Avoid heavy lifting.  Wear low-heeled shoes and practice good posture.  You may continue to have sex unless your health care provider tells you not to. Relieving pain and discomfort  Wear a good support bra to relieve breast tenderness.  Take warm sitz baths to soothe any pain or discomfort caused by hemorrhoids. Use hemorrhoid cream if your health care provider approves.  Rest with your legs elevated if you have leg cramps or low back pain.  If you develop   varicose veins in your legs, wear support hose. Elevate your feet for 15 minutes, 3-4 times a day. Limit salt in your diet. Prenatal care  Schedule your prenatal visits by the twelfth week of pregnancy. They are usually scheduled monthly at first, then more often in the last 2 months before delivery.  Write down your questions. Take them to your prenatal visits.  Keep all your prenatal visits as told by your health care provider. This is important. Safety  Wear your seat belt at all times when driving.  Make a list of emergency phone numbers, including numbers for family, friends, the hospital, and police and fire departments. General instructions  Ask your health care provider for a referral to a local prenatal education class. Begin classes no later than the beginning of month 6 of your pregnancy.  Ask for help if you have counseling or nutritional needs during pregnancy. Your health care provider can offer advice or refer you to specialists for help  with various needs.  Do not use hot tubs, steam rooms, or saunas.  Do not douche or use tampons or scented sanitary pads.  Do not cross your legs for long periods of time.  Avoid cat litter boxes and soil used by cats. These carry germs that can cause birth defects in the baby and possibly loss of the fetus by miscarriage or stillbirth.  Avoid all smoking, herbs, alcohol, and medicines not prescribed by your health care provider. Chemicals in these products affect the formation and growth of the baby.  Do not use any products that contain nicotine or tobacco, such as cigarettes and e-cigarettes. If you need help quitting, ask your health care provider. You may receive counseling support and other resources to help you quit.  Schedule a dentist appointment. At home, brush your teeth with a soft toothbrush and be gentle when you floss. Contact a health care provider if:  You have dizziness.  You have mild pelvic cramps, pelvic pressure, or nagging pain in the abdominal area.  You have persistent nausea, vomiting, or diarrhea.  You have a bad smelling vaginal discharge.  You have pain when you urinate.  You notice increased swelling in your face, hands, legs, or ankles.  You are exposed to fifth disease or chickenpox.  You are exposed to German measles (rubella) and have never had it. Get help right away if:  You have a fever.  You are leaking fluid from your vagina.  You have spotting or bleeding from your vagina.  You have severe abdominal cramping or pain.  You have rapid weight gain or loss.  You vomit blood or material that looks like coffee grounds.  You develop a severe headache.  You have shortness of breath.  You have any kind of trauma, such as from a fall or a car accident. Summary  The first trimester of pregnancy is from week 1 until the end of week 13 (months 1 through 3).  Your body goes through many changes during pregnancy. The changes vary from  woman to woman.  You will have routine prenatal visits. During those visits, your health care provider will examine you, discuss any test results you may have, and talk with you about how you are feeling. This information is not intended to replace advice given to you by your health care provider. Make sure you discuss any questions you have with your health care provider. Document Released: 05/22/2001 Document Revised: 05/09/2016 Document Reviewed: 05/09/2016 Elsevier Interactive Patient Education  2018 Elsevier   Inc.  

## 2018-01-01 NOTE — Progress Notes (Signed)
Subjective:  Andrea Serrano is a 32 y.o. G4P1021 at 73w2dbeing seen today for first OB visit. EDD by LMP and confirmed by first trimester U/S.  She has a H/O c section d/t FTP.  She is currently monitored for the following issues for this high-risk pregnancy and has Chronic hepatitis B affecting antepartum care of mother ([facility]; Thrombocytopenia ([place]; Encounter for supervision of normal first pregnancy in second trimester; Nausea and vomiting in pregnancy; and History of cesarean section on their problem list.  Patient reports nausea.  Contractions: Not present. Vag. Bleeding: None.  Movement: Absent. Denies leaking of fluid.   The following portions of the patient's history were reviewed and updated as appropriate: allergies, current medications, past family history, past medical history, past social history, past surgical history and problem list. Problem list updated.  Objective:   Vitals:   01/01/18 1402  BP: 117/76  Pulse: 76  Weight: 172 lb 12.8 oz (78.4 kg)    Fetal Status: Fetal Heart Rate (bpm): 159   Movement: Absent     General:  Alert, oriented and cooperative. Patient is in no acute distress.  Skin: Skin is warm and dry. No rash noted.   Cardiovascular: Normal heart rate noted  Respiratory: Normal respiratory effort, no problems with respiration noted  Abdomen: Soft, gravid, appropriate for gestational age. Pain/Pressure: Absent     Pelvic:  Cervical exam performed        Extremities: Normal range of motion.  Edema: None  Mental Status: Normal mood and affect. Normal behavior. Normal judgment and thought content.   Urinalysis:      Assessment and Plan:  Pregnancy: G4P1021 at 138w2d1. Encounter for supervision of normal first pregnancy in second trimester Prenatal care and labs reviewed with pt - [facility] AMB BABYSCRIPTS OPT IN - Culture, OB Urine - Cystic fibrosis gene test - Hemoglobinopathy Evaluation - Obstetric Panel, Including HIV - SMN1 COPY NUMBER ANALYSIS  (SMA Carrier Screen) - [place] OB COMP + 14 WK; Future - Cytology - PAP - [place] OB DETAIL +14 WK; Future  2. Nausea and vomiting in pregnancy  - ondansetron (ZOFRAN ODT) 4 MG disintegrating tablet; Take 1 tablet (4 mg total) by mouth every 6 (six) hours as needed for nausea.  Dispense: 20 tablet; Refill: 1  3. History of cesarean section To discuss ERLTCS vs TOLAC at later appts  4. [facility] of [facility]. No prior treatment - Comp Met (CMET) - Hepatitis B DNA, ultraquantitative, PCR - Hepatitis B E Antigen - Hepatitis B E Antibody  5. Thrombocytopenia (HCPassaicHas seen Heme/Onc in the past, no work up - Antinuclear Antib (ANA) - Plt glycoprotein (indirect) autoabs - Platelet antibodies, direct  Preterm labor symptoms and general obstetric precautions including but not limited to vaginal bleeding, contractions, leaking of fluid and fetal movement were reviewed in detail with the patient. Please refer to After Visit Summary for other counseling recommendations.  No follow-ups on file.   ErChancy MilroyMD

## 2018-01-01 NOTE — BH Specialist Note (Signed)
Integrated Behavioral Health Initial Visit  MRN: [id number] Name: Andrea Serrano  Number of [place] Clinician visits:: 1/6 Session Start time: 2:53  Session End time: 2:57 Total time: 4 minutes  Type of Service: [place] Interpretor:No. Interpretor Name and Language: n/a   Warm Hand Off Completed.       SUBJECTIVE: Priseis Cratty is a 32 y.o. female accompanied by n/a Patient was referred by Arlina Robes, MD for initial OB introduction to integrated behavioral health services. Patient reports the following symptoms/concerns: Pt states her only concern today is morning sickness.  Duration of problem: current pregnancy; Severity of problem: mild  OBJECTIVE: Mood: Normal and Affect: Appropriate Risk of harm to self or others: No plan to harm self or others  LIFE CONTEXT: Family and Social: - School/Work: - Self-Care: - Life Changes: Current pregnancy  GOALS ADDRESSED: N/A  INTERVENTIONS:  Standardized Assessments completed: Not Needed  ASSESSMENT: Patient currently experiencing Supervision of normal first pregnancy   Patient may benefit from initial OB introduction to integrated behavioral health services today.  PLAN: 1. Follow up with behavioral health clinician on : As needed 2. Behavioral recommendations:  -Take prenatal vitamins as recommended by medical provider   Garlan Fair, LCSW  Depression screen [facility] 2/9 04/26/2014 10/21/2013 10/21/2013 08/24/2013 08/04/2013  Decreased Interest 0 0 0 0 0  Down, Depressed, Hopeless 0 0 0 0 0  PHQ - 2 Score 0 0 0 0 0   No flowsheet data found.

## 2018-01-03 LAB — CYTOLOGY - PAP
CHLAMYDIA, DNA PROBE: NEGATIVE
DIAGNOSIS: NEGATIVE
HPV: NOT DETECTED
Neisseria Gonorrhea: NEGATIVE

## 2018-01-03 LAB — CULTURE, OB URINE

## 2018-01-03 LAB — URINE CULTURE, OB REFLEX

## 2018-01-08 LAB — COMPREHENSIVE METABOLIC PANEL
A/G RATIO: 1.3 (ref 1.2–2.2)
ALK PHOS: 38 IU/L — AB (ref 39–117)
ALT: 33 IU/L — ABNORMAL HIGH (ref 0–32)
AST: 18 IU/L (ref 0–40)
Albumin: 3.9 g/dL (ref 3.5–5.5)
BUN/Creatinine Ratio: 11 (ref 9–23)
BUN: 7 mg/dL (ref 6–20)
Bilirubin Total: 0.3 mg/dL (ref 0.0–1.2)
CO2: 21 mmol/L (ref 20–29)
Calcium: 8.9 mg/dL (ref 8.7–10.2)
Chloride: 104 mmol/L (ref 96–106)
Creatinine, Ser: 0.61 mg/dL (ref 0.57–1.00)
GFR calc Af Amer: 138 mL/min/{1.73_m2} (ref >59)
GFR calc non Af Amer: 119 mL/min/{1.73_m2} (ref >59)
GLOBULIN, TOTAL: 2.9 g/dL (ref 1.5–4.5)
Glucose: 66 mg/dL (ref 65–99)
POTASSIUM: 3.9 mmol/L (ref 3.5–5.2)
SODIUM: 136 mmol/L (ref 134–144)
Total Protein: 6.8 g/dL (ref 6.0–8.5)

## 2018-01-08 LAB — ANA: ANA: NEGATIVE

## 2018-01-08 LAB — OBSTETRIC PANEL, INCLUDING HIV
Antibody Screen: NEGATIVE
BASOS ABS: 0 10*3/uL (ref 0.0–0.2)
Basos: 0 %
EOS (ABSOLUTE): 0 10*3/uL (ref 0.0–0.4)
Eos: 1 %
HIV SCREEN 4TH GENERATION: NONREACTIVE
Hematocrit: 35.3 % (ref 34.0–46.6)
Hemoglobin: 11.9 g/dL (ref 11.1–15.9)
Hepatitis B Surface Ag: POSITIVE — AB
Immature Grans (Abs): 0 10*3/uL (ref 0.0–0.1)
Immature Granulocytes: 0 %
Lymphocytes Absolute: 2 10*3/uL (ref 0.7–3.1)
Lymphs: 31 %
MCH: 26.9 pg (ref 26.6–33.0)
MCHC: 33.7 g/dL (ref 31.5–35.7)
MCV: 80 fL (ref 79–97)
MONOCYTES: 5 %
Monocytes Absolute: 0.3 10*3/uL (ref 0.1–0.9)
NEUTROS ABS: 4.1 10*3/uL (ref 1.4–7.0)
Neutrophils: 63 %
PLATELETS: 129 10*3/uL — AB (ref 150–450)
RBC: 4.42 x10E6/uL (ref 3.77–5.28)
RDW: 15.8 % — AB (ref 12.3–15.4)
RPR Ser Ql: NONREACTIVE
RUBELLA: 18.5 {index} (ref >0.99)
Rh Factor: POSITIVE
WBC: 6.4 10*3/uL (ref 3.4–10.8)

## 2018-01-08 LAB — SMN1 COPY NUMBER ANALYSIS (SMA CARRIER SCREENING)

## 2018-01-08 LAB — HEMOGLOBINOPATHY EVALUATION
Ferritin: 29 ng/mL (ref 15–150)
HGB A2 QUANT: 1.9 % (ref 1.8–3.2)
HGB A: 98.1 % (ref 96.4–98.8)
HGB SOLUBILITY: NEGATIVE
HGB VARIANT: 0 %
Hgb C: 0 %
Hgb F Quant: 0 % (ref 0.0–2.0)
Hgb S: 0 %

## 2018-01-08 LAB — HEPATITIS B DNA, ULTRAQUANTITATIVE, PCR
HBV DNA SERPL PCR-ACNC: 80 [IU]/mL
HBV DNA SERPL PCR-LOG IU: 1.903 log10 IU/mL

## 2018-01-08 LAB — HEPATITIS B E ANTIBODY: Hep B E Ab: POSITIVE — AB

## 2018-01-08 LAB — HEPATITIS B E ANTIGEN: Hep B E Ag: NEGATIVE

## 2018-01-08 LAB — CYSTIC FIBROSIS GENE TEST

## 2018-01-29 ENCOUNTER — Ambulatory Visit (INDEPENDENT_AMBULATORY_CARE_PROVIDER_SITE_OTHER): Payer: Managed Care, Other (non HMO) | Admitting: Advanced Practice Midwife

## 2018-01-29 VITALS — BP 115/68 | HR 77 | Wt 172.0 lb

## 2018-01-29 DIAGNOSIS — B181 Chronic viral hepatitis B without delta-agent: Secondary | ICD-10-CM

## 2018-01-29 DIAGNOSIS — D696 Thrombocytopenia, unspecified: Secondary | ICD-10-CM

## 2018-01-29 DIAGNOSIS — Z98891 History of uterine scar from previous surgery: Secondary | ICD-10-CM

## 2018-01-29 DIAGNOSIS — Z3482 Encounter for supervision of other normal pregnancy, second trimester: Secondary | ICD-10-CM

## 2018-01-29 NOTE — Patient Instructions (Signed)
Second Trimester of Pregnancy The second trimester is from week 13 through week 28, month 4 through 6. This is often the time in pregnancy that you feel your best. Often times, morning sickness has lessened or quit. You may have more energy, and you may get hungry more often. Your unborn baby (fetus) is growing rapidly. At the end of the sixth month, he or she is about 9 inches long and weighs about 1 pounds. You will likely feel the baby move (quickening) between 18 and 20 weeks of pregnancy. Follow these instructions at home:  Avoid all smoking, herbs, and alcohol. Avoid drugs not approved by your doctor.  Do not use any tobacco products, including cigarettes, chewing tobacco, and electronic cigarettes. If you need help quitting, ask your doctor. You may get counseling or other support to help you quit.  Only take medicine as told by your doctor. Some medicines are safe and some are not during pregnancy.  Exercise only as told by your doctor. Stop exercising if you start having cramps.  Eat regular, healthy meals.  Wear a good support bra if your breasts are tender.  Do not use hot tubs, steam rooms, or saunas.  Wear your seat belt when driving.  Avoid raw meat, uncooked cheese, and liter boxes and soil used by cats.  Take your prenatal vitamins.  Take 1500-2000 milligrams of calcium daily starting at the 20th week of pregnancy until you deliver your baby.  Try taking medicine that helps you poop (stool softener) as needed, and if your doctor approves. Eat more fiber by eating fresh fruit, vegetables, and whole grains. Drink enough fluids to keep your pee (urine) clear or pale yellow.  Take warm water baths (sitz baths) to soothe pain or discomfort caused by hemorrhoids. Use hemorrhoid cream if your doctor approves.  If you have puffy, bulging veins (varicose veins), wear support hose. Raise (elevate) your feet for 15 minutes, 3-4 times a day. Limit salt in your diet.  Avoid heavy  lifting, wear low heals, and sit up straight.  Rest with your legs raised if you have leg cramps or low back pain.  Visit your dentist if you have not gone during your pregnancy. Use a soft toothbrush to brush your teeth. Be gentle when you floss.  You can have sex (intercourse) unless your doctor tells you not to.  Go to your doctor visits. Get help if:  You feel dizzy.  You have mild cramps or pressure in your lower belly (abdomen).  You have a nagging pain in your belly area.  You continue to feel sick to your stomach (nauseous), throw up (vomit), or have watery poop (diarrhea).  You have bad smelling fluid coming from your vagina.  You have pain with peeing (urination). Get help right away if:  You have a fever.  You are leaking fluid from your vagina.  You have spotting or bleeding from your vagina.  You have severe belly cramping or pain.  You lose or gain weight rapidly.  You have trouble catching your breath and have chest pain.  You notice sudden or extreme puffiness (swelling) of your face, hands, ankles, feet, or legs.  You have not felt the baby move in over an hour.  You have severe headaches that do not go away with medicine.  You have vision changes. This information is not intended to replace advice given to you by your health care provider. Make sure you discuss any questions you have with your health care   provider. Document Released: 08/22/2009 Document Revised: 11/03/2015 Document Reviewed: 07/29/2012 Elsevier Interactive Patient Education  2017 Elsevier Inc.  

## 2018-01-29 NOTE — Progress Notes (Signed)
   PRENATAL VISIT NOTE  Subjective:  Andrea Serrano is a 32 y.o. G4P1021 at [redacted]w[redacted]d being seen today for ongoing prenatal care.  She is currently monitored for the following issues for this low-risk pregnancy and has Chronic hepatitis B affecting antepartum care of mother [facility]); Thrombocytopenia ([place]); Encounter for supervision of normal first pregnancy in second trimester; Nausea and vomiting in pregnancy; and History of cesarean section on their problem list.  Patient reports no complaints.  Contractions: Not present. Vag. Bleeding: None.  Movement: Absent. Denies leaking of fluid.   The following portions of the patient's history were reviewed and updated as appropriate: allergies, current medications, past family history, past medical history, past social history, past surgical history and problem list. Problem list updated.  Objective:   Vitals:   01/29/18 1001  BP: 115/68  Pulse: 77  Weight: 172 lb (78 kg)    Fetal Status: Fetal Heart Rate (bpm): 161   Movement: Absent     General:  Alert, oriented and cooperative. Patient is in no acute distress.  Skin: Skin is warm and dry. No rash noted.   Cardiovascular: Normal heart rate noted  Respiratory: Normal respiratory effort, no problems with respiration noted  Abdomen: Soft, gravid, appropriate for gestational age.  Pain/Pressure: Absent     Pelvic: Cervical exam deferred        Extremities: Normal range of motion.  Edema: None  Mental Status: Normal mood and affect. Normal behavior. Normal judgment and thought content.   Assessment and Plan:  Pregnancy: G4P1021 at [redacted]w[redacted]d  1. Encounter for supervision of other normal pregnancy in second trimester --No complaints or concerns today, continue routine care --Nausea/vomiting resolved  2. History of cesarean delivery --Desires TOLAC, did not wish to discuss details today  3. Chronic hepatitis B ([place]) --Diagnosed 2012 --See resulting labs drawn previous visit  4.  Thrombocytopenia ([place]) --Previously address by heme/onc, no current active management  Preterm labor symptoms and general obstetric precautions including but not limited to vaginal bleeding, contractions, leaking of fluid and fetal movement were reviewed in detail with the patient. Please refer to After Visit Summary for other counseling recommendations.  Return in about 4 weeks (around 02/26/2018).  Future Appointments  Date Time Provider [place]  02/11/2018 10:30 AM [place] 5 WH-MFCUS MFC-US  02/20/2018  9:35 AM Darlina Rumpf, CNM WOC-WOCA [place], [place]  01/29/18  10:18 AM

## 2018-02-03 ENCOUNTER — Encounter (HOSPITAL_COMMUNITY): Payer: Self-pay

## 2018-02-11 ENCOUNTER — Other Ambulatory Visit (HOSPITAL_COMMUNITY): Payer: Self-pay | Admitting: *Deleted

## 2018-02-11 ENCOUNTER — Ambulatory Visit (HOSPITAL_COMMUNITY)
Admission: RE | Admit: 2018-02-11 | Discharge: 2018-02-11 | Disposition: A | Discharge status: Home / Self Care | Payer: Managed Care, Other (non HMO) | Source: Ambulatory Visit | Attending: Obstetrics and Gynecology | Admitting: Obstetrics and Gynecology

## 2018-02-11 ENCOUNTER — Encounter (HOSPITAL_COMMUNITY): Payer: Self-pay

## 2018-02-11 DIAGNOSIS — Z3A19 19 weeks gestation of pregnancy: Secondary | ICD-10-CM | POA: Diagnosis not present

## 2018-02-11 DIAGNOSIS — O99212 Obesity complicating pregnancy, second trimester: Secondary | ICD-10-CM | POA: Diagnosis not present

## 2018-02-11 DIAGNOSIS — O34219 Maternal care for unspecified type scar from previous cesarean delivery: Secondary | ICD-10-CM | POA: Diagnosis not present

## 2018-02-11 DIAGNOSIS — O98412 Viral hepatitis complicating pregnancy, second trimester: Secondary | ICD-10-CM

## 2018-02-11 DIAGNOSIS — Z3402 Encounter for supervision of normal first pregnancy, second trimester: Secondary | ICD-10-CM

## 2018-02-11 DIAGNOSIS — D259 Leiomyoma of uterus, unspecified: Secondary | ICD-10-CM | POA: Diagnosis not present

## 2018-02-11 DIAGNOSIS — Z362 Encounter for other antenatal screening follow-up: Secondary | ICD-10-CM

## 2018-02-11 DIAGNOSIS — D696 Thrombocytopenia, unspecified: Secondary | ICD-10-CM

## 2018-02-11 DIAGNOSIS — B181 Chronic viral hepatitis B without delta-agent: Secondary | ICD-10-CM | POA: Insufficient documentation

## 2018-02-11 DIAGNOSIS — Z363 Encounter for antenatal screening for malformations: Secondary | ICD-10-CM | POA: Insufficient documentation

## 2018-02-11 DIAGNOSIS — B1910 Unspecified viral hepatitis B without hepatic coma: Secondary | ICD-10-CM | POA: Diagnosis not present

## 2018-02-11 DIAGNOSIS — O3412 Maternal care for benign tumor of corpus uteri, second trimester: Secondary | ICD-10-CM | POA: Insufficient documentation

## 2018-02-11 DIAGNOSIS — O99112 Other diseases of the blood and blood-forming organs and certain disorders involving the immune mechanism complicating pregnancy, second trimester: Secondary | ICD-10-CM

## 2018-02-20 ENCOUNTER — Ambulatory Visit (INDEPENDENT_AMBULATORY_CARE_PROVIDER_SITE_OTHER): Payer: Managed Care, Other (non HMO) | Admitting: Advanced Practice Midwife

## 2018-02-20 VITALS — BP 116/73 | HR 87 | Wt 177.3 lb

## 2018-02-20 DIAGNOSIS — Z3482 Encounter for supervision of other normal pregnancy, second trimester: Secondary | ICD-10-CM

## 2018-02-20 DIAGNOSIS — Z98891 History of uterine scar from previous surgery: Secondary | ICD-10-CM

## 2018-02-20 DIAGNOSIS — D696 Thrombocytopenia, unspecified: Secondary | ICD-10-CM

## 2018-02-20 DIAGNOSIS — B181 Chronic viral hepatitis B without delta-agent: Secondary | ICD-10-CM

## 2018-02-20 NOTE — Progress Notes (Signed)
Pt states has not felt baby move since 02/03/18

## 2018-02-20 NOTE — Progress Notes (Signed)
   PRENATAL VISIT NOTE  Subjective:  Andrea Serrano is a 32 y.o. G4P1021 at [redacted]w[redacted]d being seen today for ongoing prenatal care.  She is currently monitored for the following issues for this high-risk pregnancy and has Chronic hepatitis B affecting antepartum care of mother [facility]); Thrombocytopenia ([place]); Encounter for supervision of normal first pregnancy in second trimester; Nausea and vomiting in pregnancy; and History of cesarean section on their problem list.  Patient reports decreased fetal movement.  Contractions: Not present. Vag. Bleeding: None.  Movement: (!) Decreased. Denies leaking of fluid.   The following portions of the patient's history were reviewed and updated as appropriate: allergies, current medications, past family history, past medical history, past social history, past surgical history and problem list. Problem list updated.  Objective:   Vitals:   02/20/18 0929  BP: 116/73  Pulse: 87  Weight: 177 lb 4.8 oz (80.4 kg)    Fetal Status: Fetal Heart Rate (bpm): 141   Movement: (!) Decreased     General:  Alert, oriented and cooperative. Patient is in no acute distress.  Skin: Skin is warm and dry. No rash noted.   Cardiovascular: Normal heart rate noted  Respiratory: Normal respiratory effort, no problems with respiration noted  Abdomen: Soft, gravid, appropriate for gestational age.  Pain/Pressure: Absent     Pelvic: Cervical exam deferred        Extremities: Normal range of motion.  Edema: None  Mental Status: Normal mood and affect. Normal behavior. Normal judgment and thought content.   Assessment and Plan:  Pregnancy: [id number] at [redacted]w[redacted]d  1. Encounter for supervision of other normal pregnancy in second trimester --C/o DFM, fetal movement palpated during exam --Patient states she never felt her previous child move either --Discussed future plan to track kick counts around 30 weeks  2. Hx of cesarean section --Continues to desire TOLAC vs ERLTCS --Confirmed  that next appt should be with MD/DO  3. Chronic hepatitis B ([place]) --Dx 2012 --Labs drawn 01/01/18  4. Thrombocytopenia ([place]) --Not currently manager by Heme/Onc  Preterm labor symptoms and general obstetric precautions including but not limited to vaginal bleeding, contractions, leaking of fluid and fetal movement were reviewed in detail with the patient. Please refer to After Visit Summary for other counseling recommendations.  Return in about 4 weeks (around 03/20/2018), or for MD/DO to discuss TOLAC.  Future Appointments  Date Time Provider [place]  03/12/2018 10:45 AM WH-MFC [place] 2 WH-MFCUS MFC-US    Samantha C Weinhold, CNM

## 2018-02-20 NOTE — Patient Instructions (Signed)
Second Trimester of Pregnancy The second trimester is from week 13 through week 28, month 4 through 6. This is often the time in pregnancy that you feel your best. Often times, morning sickness has lessened or quit. You may have more energy, and you may get hungry more often. Your unborn baby (fetus) is growing rapidly. At the end of the sixth month, he or she is about 9 inches long and weighs about 1 pounds. You will likely feel the baby move (quickening) between 18 and 20 weeks of pregnancy.  Research childbirth classes and hospital preregistration at [email or web address]  Follow these instructions at home:  Avoid all smoking, herbs, and alcohol. Avoid drugs not approved by your doctor.  Do not use any tobacco products, including cigarettes, chewing tobacco, and electronic cigarettes. If you need help quitting, ask your doctor. You may get counseling or other support to help you quit.  Only take medicine as told by your doctor. Some medicines are safe and some are not during pregnancy.  Exercise only as told by your doctor. Stop exercising if you start having cramps.  Eat regular, healthy meals.  Wear a good support bra if your breasts are tender.  Do not use hot tubs, steam rooms, or saunas.  Wear your seat belt when driving.  Avoid raw meat, uncooked cheese, and liter boxes and soil used by cats.  Take your prenatal vitamins.  Take 1500-2000 milligrams of calcium daily starting at the 20th week of pregnancy until you deliver your baby.  Try taking medicine that helps you poop (stool softener) as needed, and if your doctor approves. Eat more fiber by eating fresh fruit, vegetables, and whole grains. Drink enough fluids to keep your pee (urine) clear or pale yellow.  Take warm water baths (sitz baths) to soothe pain or discomfort caused by hemorrhoids. Use hemorrhoid cream if your doctor approves.  If you have puffy, bulging veins (varicose veins), wear support hose. Raise  (elevate) your feet for 15 minutes, 3-4 times a day. Limit salt in your diet.  Avoid heavy lifting, wear low heals, and sit up straight.  Rest with your legs raised if you have leg cramps or low back pain.  Visit your dentist if you have not gone during your pregnancy. Use a soft toothbrush to brush your teeth. Be gentle when you floss.  You can have sex (intercourse) unless your doctor tells you not to.  Go to your doctor visits.  Get help if:  You feel dizzy.  You have mild cramps or pressure in your lower belly (abdomen).  You have a nagging pain in your belly area.  You continue to feel sick to your stomach (nauseous), throw up (vomit), or have watery poop (diarrhea).  You have bad smelling fluid coming from your vagina.  You have pain with peeing (urination). Get help right away if:  You have a fever.  You are leaking fluid from your vagina.  You have spotting or bleeding from your vagina.  You have severe belly cramping or pain.  You lose or gain weight rapidly.  You have trouble catching your breath and have chest pain.  You notice sudden or extreme puffiness (swelling) of your face, hands, ankles, feet, or legs.  You have not felt the baby move in over an hour.  You have severe headaches that do not go away with medicine.  You have vision changes. This information is not intended to replace advice given to you by your health care provider. Make   sure you discuss any questions you have with your health care provider. Document Released: 08/22/2009 Document Revised: 11/03/2015 Document Reviewed: 07/29/2012 Elsevier Interactive Patient Education  2017 [employer]    

## 2018-03-12 ENCOUNTER — Encounter (HOSPITAL_COMMUNITY): Payer: Self-pay

## 2018-03-12 ENCOUNTER — Ambulatory Visit (HOSPITAL_COMMUNITY)
Admission: RE | Admit: 2018-03-12 | Discharge: 2018-03-12 | Disposition: A | Discharge status: Home / Self Care | Payer: Managed Care, Other (non HMO) | Source: Ambulatory Visit | Attending: Obstetrics and Gynecology | Admitting: Obstetrics and Gynecology

## 2018-03-12 ENCOUNTER — Other Ambulatory Visit (HOSPITAL_COMMUNITY): Payer: Self-pay | Admitting: *Deleted

## 2018-03-12 DIAGNOSIS — O358XX0 Maternal care for other (suspected) fetal abnormality and damage, not applicable or unspecified: Secondary | ICD-10-CM

## 2018-03-12 DIAGNOSIS — Z3A23 23 weeks gestation of pregnancy: Secondary | ICD-10-CM | POA: Diagnosis not present

## 2018-03-12 DIAGNOSIS — O3412 Maternal care for benign tumor of corpus uteri, second trimester: Secondary | ICD-10-CM | POA: Diagnosis not present

## 2018-03-12 DIAGNOSIS — Z362 Encounter for other antenatal screening follow-up: Secondary | ICD-10-CM

## 2018-03-12 DIAGNOSIS — B1910 Unspecified viral hepatitis B without hepatic coma: Secondary | ICD-10-CM

## 2018-03-12 DIAGNOSIS — O99112 Other diseases of the blood and blood-forming organs and certain disorders involving the immune mechanism complicating pregnancy, second trimester: Secondary | ICD-10-CM | POA: Diagnosis not present

## 2018-03-12 DIAGNOSIS — O9989 Other specified diseases and conditions complicating pregnancy, childbirth and the puerperium: Secondary | ICD-10-CM | POA: Insufficient documentation

## 2018-03-12 DIAGNOSIS — Z363 Encounter for antenatal screening for malformations: Secondary | ICD-10-CM | POA: Diagnosis present

## 2018-03-12 DIAGNOSIS — O35EXX0 Maternal care for other (suspected) fetal abnormality and damage, fetal genitourinary anomalies, not applicable or unspecified: Secondary | ICD-10-CM

## 2018-03-12 DIAGNOSIS — O9842 Viral hepatitis complicating childbirth: Secondary | ICD-10-CM

## 2018-03-12 DIAGNOSIS — N1330 Unspecified hydronephrosis: Secondary | ICD-10-CM | POA: Insufficient documentation

## 2018-03-12 DIAGNOSIS — O34219 Maternal care for unspecified type scar from previous cesarean delivery: Secondary | ICD-10-CM

## 2018-03-20 ENCOUNTER — Ambulatory Visit (INDEPENDENT_AMBULATORY_CARE_PROVIDER_SITE_OTHER): Payer: Managed Care, Other (non HMO) | Admitting: Obstetrics & Gynecology

## 2018-03-20 VITALS — BP 113/72 | HR 82 | Wt 183.6 lb

## 2018-03-20 DIAGNOSIS — Z3402 Encounter for supervision of normal first pregnancy, second trimester: Secondary | ICD-10-CM

## 2018-03-20 DIAGNOSIS — Z23 Encounter for immunization: Secondary | ICD-10-CM

## 2018-03-20 NOTE — Progress Notes (Signed)
   PRENATAL VISIT NOTE  Subjective:  Andrea Serrano is a 32 y.o. G4P1021 at [redacted]w[redacted]d being seen today for ongoing prenatal care.  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  Patient reports no complaints.  Contractions: Not present. Vag. Bleeding: None.  Movement: Present. Denies leaking of fluid.   The following portions of the patient's history were reviewed and updated as appropriate: allergies, current medications, past family history, past medical history, past social history, past surgical history and problem list. Problem list updated.  Objective:   Vitals:   03/20/18 1006  BP: 113/72  Pulse: 82  Weight: 183 lb 9.6 oz (83.3 kg)    Fetal Status: Fetal Heart Rate (bpm): 147 Fundal Height: 24 cm Movement: Present     General:  Alert, oriented and cooperative. Patient is in no acute distress.  Skin: Skin is warm and dry. No rash noted.   Cardiovascular: Normal heart rate noted  Respiratory: Normal respiratory effort, no problems with respiration noted  Abdomen: Soft, gravid, appropriate for gestational age.  Pain/Pressure: Absent     Pelvic: Cervical exam deferred        Extremities: Normal range of motion.  Edema: None  Mental Status: Normal mood and affect. Normal behavior. Normal judgment and thought content.   Assessment and Plan:  Pregnancy: G4P1021 at [redacted]w[redacted]d  1. Encounter for supervision of normal first pregnancy in second trimester  - Flu Vaccine QUAD 36+ mos IM  Preterm labor symptoms and general obstetric precautions including but not limited to vaginal bleeding, contractions, leaking of fluid and fetal movement were reviewed in detail with the patient. Please refer to After Visit  Summary for other counseling recommendations.  Return in about 4 weeks (around 04/17/2018).  Future Appointments  Date Time Provider [place]  04/16/2018 10:30 AM WH-MFC [place] 1 WH-MFCUS MFC-US    Emeterio Reeve, MD

## 2018-03-20 NOTE — Patient Instructions (Signed)
Second Trimester of Pregnancy The second trimester is from week 13 through week 28, month 4 through 6. This is often the time in pregnancy that you feel your best. Often times, morning sickness has lessened or quit. You may have more energy, and you may get hungry more often. Your unborn baby (fetus) is growing rapidly. At the end of the sixth month, he or she is about 9 inches long and weighs about 1 pounds. You will likely feel the baby move (quickening) between 18 and 20 weeks of pregnancy. Follow these instructions at home:  Avoid all smoking, herbs, and alcohol. Avoid drugs not approved by your doctor.  Do not use any tobacco products, including cigarettes, chewing tobacco, and electronic cigarettes. If you need help quitting, ask your doctor. You may get counseling or other support to help you quit.  Only take medicine as told by your doctor. Some medicines are safe and some are not during pregnancy.  Exercise only as told by your doctor. Stop exercising if you start having cramps.  Eat regular, healthy meals.  Wear a good support bra if your breasts are tender.  Do not use hot tubs, steam rooms, or saunas.  Wear your seat belt when driving.  Avoid raw meat, uncooked cheese, and liter boxes and soil used by cats.  Take your prenatal vitamins.  Take 1500-2000 milligrams of calcium daily starting at the 20th week of pregnancy until you deliver your baby.  Try taking medicine that helps you poop (stool softener) as needed, and if your doctor approves. Eat more fiber by eating fresh fruit, vegetables, and whole grains. Drink enough fluids to keep your pee (urine) clear or pale yellow.  Take warm water baths (sitz baths) to soothe pain or discomfort caused by hemorrhoids. Use hemorrhoid cream if your doctor approves.  If you have puffy, bulging veins (varicose veins), wear support hose. Raise (elevate) your feet for 15 minutes, 3-4 times a day. Limit salt in your diet.  Avoid heavy  lifting, wear low heals, and sit up straight.  Rest with your legs raised if you have leg cramps or low back pain.  Visit your dentist if you have not gone during your pregnancy. Use a soft toothbrush to brush your teeth. Be gentle when you floss.  You can have sex (intercourse) unless your doctor tells you not to.  Go to your doctor visits. Get help if:  You feel dizzy.  You have mild cramps or pressure in your lower belly (abdomen).  You have a nagging pain in your belly area.  You continue to feel sick to your stomach (nauseous), throw up (vomit), or have watery poop (diarrhea).  You have bad smelling fluid coming from your vagina.  You have pain with peeing (urination). Get help right away if:  You have a fever.  You are leaking fluid from your vagina.  You have spotting or bleeding from your vagina.  You have severe belly cramping or pain.  You lose or gain weight rapidly.  You have trouble catching your breath and have chest pain.  You notice sudden or extreme puffiness (swelling) of your face, hands, ankles, feet, or legs.  You have not felt the baby move in over an hour.  You have severe headaches that do not go away with medicine.  You have vision changes. This information is not intended to replace advice given to you by your health care provider. Make sure you discuss any questions you have with your health care   provider. Document Released: 08/22/2009 Document Revised: 11/03/2015 Document Reviewed: 07/29/2012 Elsevier Interactive Patient Education  2017 Elsevier Inc.  

## 2018-04-16 ENCOUNTER — Ambulatory Visit (HOSPITAL_COMMUNITY)
Admission: RE | Admit: 2018-04-16 | Discharge: 2018-04-16 | Disposition: A | Discharge status: Home / Self Care | Payer: Medicaid Other | Source: Ambulatory Visit | Attending: Obstetrics & Gynecology | Admitting: Obstetrics & Gynecology

## 2018-04-16 ENCOUNTER — Encounter (HOSPITAL_COMMUNITY): Payer: Self-pay

## 2018-04-16 DIAGNOSIS — B1910 Unspecified viral hepatitis B without hepatic coma: Secondary | ICD-10-CM

## 2018-04-16 DIAGNOSIS — O34219 Maternal care for unspecified type scar from previous cesarean delivery: Secondary | ICD-10-CM | POA: Diagnosis not present

## 2018-04-16 DIAGNOSIS — D696 Thrombocytopenia, unspecified: Secondary | ICD-10-CM | POA: Diagnosis not present

## 2018-04-16 DIAGNOSIS — O3410 Maternal care for benign tumor of corpus uteri, unspecified trimester: Secondary | ICD-10-CM | POA: Diagnosis not present

## 2018-04-16 DIAGNOSIS — Z3A28 28 weeks gestation of pregnancy: Secondary | ICD-10-CM | POA: Insufficient documentation

## 2018-04-16 DIAGNOSIS — O358XX0 Maternal care for other (suspected) fetal abnormality and damage, not applicable or unspecified: Secondary | ICD-10-CM

## 2018-04-16 DIAGNOSIS — Z362 Encounter for other antenatal screening follow-up: Secondary | ICD-10-CM | POA: Insufficient documentation

## 2018-04-16 DIAGNOSIS — O99113 Other diseases of the blood and blood-forming organs and certain disorders involving the immune mechanism complicating pregnancy, third trimester: Secondary | ICD-10-CM | POA: Insufficient documentation

## 2018-04-16 DIAGNOSIS — O35EXX0 Maternal care for other (suspected) fetal abnormality and damage, fetal genitourinary anomalies, not applicable or unspecified: Secondary | ICD-10-CM

## 2018-04-16 DIAGNOSIS — O98413 Viral hepatitis complicating pregnancy, third trimester: Secondary | ICD-10-CM

## 2018-04-18 ENCOUNTER — Ambulatory Visit (INDEPENDENT_AMBULATORY_CARE_PROVIDER_SITE_OTHER): Payer: Medicaid Other | Admitting: Obstetrics & Gynecology

## 2018-04-18 VITALS — BP 111/67 | HR 79 | Wt 188.7 lb

## 2018-04-18 DIAGNOSIS — Z23 Encounter for immunization: Secondary | ICD-10-CM | POA: Diagnosis not present

## 2018-04-18 DIAGNOSIS — O98419 Viral hepatitis complicating pregnancy, unspecified trimester: Secondary | ICD-10-CM

## 2018-04-18 DIAGNOSIS — Z3483 Encounter for supervision of other normal pregnancy, third trimester: Secondary | ICD-10-CM

## 2018-04-18 DIAGNOSIS — D696 Thrombocytopenia, unspecified: Secondary | ICD-10-CM

## 2018-04-18 DIAGNOSIS — O99119 Other diseases of the blood and blood-forming organs and certain disorders involving the immune mechanism complicating pregnancy, unspecified trimester: Secondary | ICD-10-CM

## 2018-04-18 DIAGNOSIS — Z98891 History of uterine scar from previous surgery: Secondary | ICD-10-CM

## 2018-04-18 DIAGNOSIS — B181 Chronic viral hepatitis B without delta-agent: Secondary | ICD-10-CM

## 2018-04-18 NOTE — Progress Notes (Signed)
PRENATAL VISIT NOTE  Subjective:  Andrea Serrano is a 32 y.o. G4P1021 at [redacted]w[redacted]d being seen today for ongoing prenatal care.  She is currently monitored for the following issues for this high-risk pregnancy and has Chronic hepatitis B affecting antepartum care of mother [facility]); Thrombocytopenia ([place]); Encounter for supervision of normal pregnancy in multigravida in third trimester; and History of cesarean section on their problem list.  Patient reports no complaints.  Contractions: Not present. Vag. Bleeding: None.  Movement: Present. Denies leaking of fluid.   The following portions of the patient's history were reviewed and updated as appropriate: allergies, current medications, past family history, past medical history, past social history, past surgical history and problem list. Problem list updated.  Objective:   Vitals:   04/18/18 0913  BP: 111/67  Pulse: 79  Weight: 188 lb 11.2 oz (85.6 kg)    Fetal Status: Fetal Heart Rate (bpm): 147 Fundal Height: 28 cm Movement: Present     General:  Alert, oriented and cooperative. Patient is in no acute distress.  Skin: Skin is warm and dry. No rash noted.   Cardiovascular: Normal heart rate noted  Respiratory: Normal respiratory effort, no problems with respiration noted  Abdomen: Soft, gravid, appropriate for gestational age.  Pain/Pressure: Absent     Pelvic: Cervical exam deferred        Extremities: Normal range of motion.  Edema: None  Mental Status: Normal mood and affect. Normal behavior. Normal judgment and thought content.   [place] Mfm Ob Follow Up  Result Date: 04/16/2018 ----------------------------------------------------------------------  OBSTETRICS REPORT                       (Signed Final 04/16/2018 01:56 pm) ---------------------------------------------------------------------- Patient Info  ID #:       [id number]                          D.O.B.:  12/15/1985 (32 yrs)  Name:       Andrea Serrano                 Visit Date:  04/16/2018 10:35 am ---------------------------------------------------------------------- Performed By  Performed By:     Elisabeth Cara        Secondary Phy.:    [facility] for                                                              [facility]  Healthcare  Attending:        Sander Nephew      Address:           [facility]                    MD                                                              [facility]                                                              [place]                                                              [place], [place]  Referred By:      Chancy Milroy        Location:          [facility]                    MD  Ref. Address:     [place], [place] ---------------------------------------------------------------------- Orders   #  Description                          Code         Ordered By   1  [place] MFM OB FOLLOW UP                  90300.92     Lora Paula  ----------------------------------------------------------------------   #  Order #                    Accession #  Episode #   1  [id number]                  [id number]                  [id number]  ---------------------------------------------------------------------- Indications   Encounter for other antenatal screening        Z36.2   follow-up   Hepatitis B complicating pregnancy (Dx         O98.419, B19.10   2012)   History of cesarean delivery, currently        O34.219   pregnant   Thrombocytopenia affecting pregnancy,          O99.119, D69.6   antepartum (01/01/18 Platelets 129)   Uterine fibroids                                O34.10   [redacted] weeks gestation of pregnancy                Z3A.28  ---------------------------------------------------------------------- Vital Signs                                                 Height:        5'3" ---------------------------------------------------------------------- Fetal Evaluation  Num Of Fetuses:          1  Fetal Heart Rate(bpm):   153  Cardiac Activity:        Observed  Presentation:            Variable  Placenta:                Posterior  P. Cord Insertion:       Previously Visualized  Amniotic Fluid  AFI FV:      Within normal limits  AFI Sum(cm)     %Tile       Largest Pocket(cm)  15.02           52          4.43  RUQ(cm)       RLQ(cm)       LUQ(cm)        LLQ(cm)  3.33          3.84          3.42           4.43 ---------------------------------------------------------------------- Biometry  BPD:      66.1  mm     G. Age:  26w 4d          4  %    CI:        69.17   %    70 - 86                                                          FL/HC:       20.6  %    18.8 - 20.6  HC:      253.8  mm     G. Age:  27w 4d          7  %    HC/AC:  1.07       1.05 - 1.21  AC:      236.5  mm     G. Age:  28w 0d         33  %    FL/BPD:      79.0  %    71 - 87  FL:       52.2  mm     G. Age:  27w 6d         23  %    FL/AC:       22.1  %    20 - 24  HUM:      45.4  mm     G. Age:  26w 6d         14  %  Est. FW:    1127   gm     2 lb 8 oz     41  % ---------------------------------------------------------------------- OB History  Gravidity:    4         Term:   1  TOP:          2        Living:  1 ---------------------------------------------------------------------- Gestational Age  LMP:           28w 2d        Date:  09/30/17                 EDD:   07/07/18  U/S Today:     27w 4d                                        EDD:   07/12/18  Best:          28w 2d     Det. By:  LMP  (09/30/17)          EDD:   07/07/18 ----------------------------------------------------------------------  Anatomy  Cranium:               Appears normal         Aortic Arch:            Previously seen  Cavum:                 Previously seen        Ductal Arch:            Previously seen  Ventricles:            Appears normal         Diaphragm:              Previously seen  Choroid Plexus:        Previously seen        Stomach:                Appears normal, left                                                                        sided  Cerebellum:            Previously seen        Abdomen:  Appears normal  Posterior Fossa:       Previously seen        Abdominal Wall:         Previously seen  Nuchal Fold:           Previously seen        Cord Vessels:           Previously seen  Face:                  Orbits and profile     Kidneys:                Appear normal                         previously seen  Lips:                  Previously seen        Bladder:                Appears normal  Thoracic:              Appears normal         Spine:                  Previously seen  Heart:                 Previously seen        Upper Extremities:      Previously seen  RVOT:                  Previously seen        Lower Extremities:      Previously seen  LVOT:                  Appears normal  Other:  Female gender seen. Heels and Nasal bone previously visualized.          Technically difficult due to fetal position. 3VV and 3VTV visualized. ---------------------------------------------------------------------- Cervix Uterus Adnexa  Cervix  Not visualized (advanced GA >24wks)  Uterus  Multiple fibroids noted, see table below.  Left Ovary  Not visualized.  Right Ovary  Not visualized.  Adnexa  No abnormality visualized. No adnexal mass  visualized. ---------------------------------------------------------------------- Myomas   Site                     L(cm)      W(cm)      D(cm)       Location   Anterior                 3          2.3        1.2   Anterior                 1.8        0.7        1.4   ----------------------------------------------------------------------   Blood Flow                 RI        PI       Comments  ---------------------------------------------------------------------- Impression  Normal interval growth. ---------------------------------------------------------------------- Recommendations  Follow up growth in 4-6 weeks. ----------------------------------------------------------------------               Sander Nephew, MD Electronically Signed Final Report   04/16/2018 01:56 pm ----------------------------------------------------------------------  Assessment and Plan:  Pregnancy: G4P1021 at [redacted]w[redacted]d  1. 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 Thrombocytopenia during pregnancy ([place]) CBC Latest Ref Rng & Units 01/01/2018 12/18/2017 12/31/2013  WBC 3.4 - 10.8 x10E3/uL 6.4 5.7 13.9(H)  Hemoglobin 11.1 - 15.9 g/dL 11.9 11.8(L) 10.9(L)  Hematocrit 34.0 - 46.6 % 35.3 35.6(L) 33.0(L)  Platelets 150 - 450 x10E3/uL 129(L) 97(L) 99(L)  Will follow up counts during CBC done today. - CBC  3. History of cesarean section Counseled regarding TOLAC vs RCS; risks/benefits discussed in detail. All questions answered.  Patient elects for TOLAC, consent signed 04/18/2018.  4. Encounter for supervision of normal pregnancy in multigravida in third trimester Third trimester labs done today, Tdap given - CBC - Glucose Tolerance, 2 Hours w/1 Hour - HIV Antibody (routine testing w rflx) - RPR - Tdap vaccine greater than or equal to 7yo IM Growth scan within normal limits, no need for repeat growth scan for now. Preterm labor symptoms and general obstetric precautions including but not limited to vaginal bleeding, contractions, leaking of fluid and fetal movement were reviewed in detail with the patient. Please refer to After Visit Summary for  other counseling recommendations.   Return in about 2 weeks (around 05/02/2018) for OB Visit ([place]).  Verita Schneiders, MD

## 2018-04-18 NOTE — Patient Instructions (Signed)
Return to clinic for any scheduled appointments or obstetric concerns, or go to [facility] for evaluation   Third Trimester of Pregnancy The third trimester is from week 28 through week 40 (months 7 through 9). The third trimester is a time when the unborn baby (fetus) is growing rapidly. At the end of the ninth month, the fetus is about 20 inches in length and weighs 6-10 pounds. Body changes during your third trimester Your body will continue to go through many changes during pregnancy. The changes vary from woman to woman. During the third trimester:  Your weight will continue to increase. You can expect to gain 25-35 pounds (11-16 kg) by the end of the pregnancy.  You may begin to get stretch marks on your hips, abdomen, and breasts.  You may urinate more often because the fetus is moving lower into your pelvis and pressing on your bladder.  You may develop or continue to have heartburn. This is caused by increased hormones that slow down muscles in the digestive tract.  You may develop or continue to have constipation because increased hormones slow digestion and cause the muscles that push waste through your intestines to relax.  You may develop hemorrhoids. These are swollen veins (varicose veins) in the rectum that can itch or be painful.  You may develop swollen, bulging veins (varicose veins) in your legs.  You may have increased body aches in the pelvis, back, or thighs. This is due to weight gain and increased hormones that are relaxing your joints.  You may have changes in your hair. These can include thickening of your hair, rapid growth, and changes in texture. Some women also have hair loss during or after pregnancy, or hair that feels dry or thin. Your hair will most likely return to normal after your baby is born.  Your breasts will continue to grow and they will continue to become tender. A yellow fluid (colostrum) may leak from your breasts. This is the first milk you are  producing for your baby.  Your belly button may stick out.  You may notice more swelling in your hands, face, or ankles.  You may have increased tingling or numbness in your hands, arms, and legs. The skin on your belly may also feel numb.  You may feel short of breath because of your expanding uterus.  You may have more problems sleeping. This can be caused by the size of your belly, increased need to urinate, and an increase in your body's metabolism.  You may notice the fetus "dropping," or moving lower in your abdomen (lightening).  You may have increased vaginal discharge.  You may notice your joints feel loose and you may have pain around your pelvic bone.  What to expect at prenatal visits You will have prenatal exams every 2 weeks until week 36. Then you will have weekly prenatal exams. During a routine prenatal visit:  You will be weighed to make sure you and the baby are growing normally.  Your blood pressure will be taken.  Your abdomen will be measured to track your baby's growth.  The fetal heartbeat will be listened to.  Any test results from the previous visit will be discussed.  You may have a cervical check near your due date to see if your cervix has softened or thinned (effaced).  You will be tested for Group B streptococcus. This happens between 35 and 37 weeks.  Your health care provider may ask you:  What your birth plan is.  How you are feeling.  If you are feeling the baby move.  If you have had any abnormal symptoms, such as leaking fluid, bleeding, severe headaches, or abdominal cramping.  If you are using any tobacco products, including cigarettes, chewing tobacco, and electronic cigarettes.  If you have any questions.  Other tests or screenings that may be performed during your third trimester include:  Blood tests that check for low iron levels (anemia).  Fetal testing to check the health, activity level, and growth of the fetus.  Testing is done if you have certain medical conditions or if there are problems during the pregnancy.  Nonstress test (NST). This test checks the health of your baby to make sure there are no signs of problems, such as the baby not getting enough oxygen. During this test, a belt is placed around your belly. The baby is made to move, and its heart rate is monitored during movement.  What is false labor? False labor is a condition in which you feel small, irregular tightenings of the muscles in the womb (contractions) that usually go away with rest, changing position, or drinking water. These are called Braxton Hicks contractions. Contractions may last for hours, days, or even weeks before true labor sets in. If contractions come at regular intervals, become more frequent, increase in intensity, or become painful, you should see your health care provider. What are the signs of labor?  Abdominal cramps.  Regular contractions that start at 10 minutes apart and become stronger and more frequent with time.  Contractions that start on the top of the uterus and spread down to the lower abdomen and back.  Increased pelvic pressure and dull back pain.  A watery or bloody mucus discharge that comes from the vagina.  Leaking of amniotic fluid. This is also known as your "water breaking." It could be a slow trickle or a gush. Let your health care provider know if it has a color or strange odor. If you have any of these signs, call your health care provider right away, even if it is before your due date. Follow these instructions at home: Medicines  Follow your health care provider's instructions regarding medicine use. Specific medicines may be either safe or unsafe to take during pregnancy.  Take a prenatal vitamin that contains at least 600 micrograms (mcg) of folic acid.  If you develop constipation, try taking a stool softener if your health care provider approves. Eating and drinking  Eat a  balanced diet that includes fresh fruits and vegetables, whole grains, good sources of protein such as meat, eggs, or tofu, and low-fat dairy. Your health care provider will help you determine the amount of weight gain that is right for you.  Avoid raw meat and uncooked cheese. These carry germs that can cause birth defects in the baby.  If you have low calcium intake from food, talk to your health care provider about whether you should take a daily calcium supplement.  Eat four or five small meals rather than three large meals a day.  Limit foods that are high in fat and processed sugars, such as fried and sweet foods.  To prevent constipation: ? Drink enough fluid to keep your urine clear or pale yellow. ? Eat foods that are high in fiber, such as fresh fruits and vegetables, whole grains, and beans. Activity  Exercise only as directed by your health care provider. Most women can continue their usual exercise routine during pregnancy. Try to exercise for 30  minutes at least 5 days a week. Stop exercising if you experience uterine contractions.  Avoid heavy lifting.  Do not exercise in extreme heat or humidity, or at high altitudes.  Wear low-heel, comfortable shoes.  Practice good posture.  You may continue to have sex unless your health care provider tells you otherwise. Relieving pain and discomfort  Take frequent breaks and rest with your legs elevated if you have leg cramps or low back pain.  Take warm sitz baths to soothe any pain or discomfort caused by hemorrhoids. Use hemorrhoid cream if your health care provider approves.  Wear a good support bra to prevent discomfort from breast tenderness.  If you develop varicose veins: ? Wear support pantyhose or compression stockings as told by your healthcare provider. ? Elevate your feet for 15 minutes, 3-4 times a day. Prenatal care  Write down your questions. Take them to your prenatal visits.  Keep all your prenatal  visits as told by your health care provider. This is important. Safety  Wear your seat belt at all times when driving.  Make a list of emergency phone numbers, including numbers for family, friends, the hospital, and police and fire departments. General instructions  Avoid cat litter boxes and soil used by cats. These carry germs that can cause birth defects in the baby. If you have a cat, ask someone to clean the litter box for you.  Do not travel far distances unless it is absolutely necessary and only with the approval of your health care provider.  Do not use hot tubs, steam rooms, or saunas.  Do not drink alcohol.  Do not use any products that contain nicotine or tobacco, such as cigarettes and e-cigarettes. If you need help quitting, ask your health care provider.  Do not use any medicinal herbs or unprescribed drugs. These chemicals affect the formation and growth of the baby.  Do not douche or use tampons or scented sanitary pads.  Do not cross your legs for long periods of time.  To prepare for the arrival of your baby: ? Take prenatal classes to understand, practice, and ask questions about labor and delivery. ? Make a trial run to the hospital. ? Visit the hospital and tour the maternity area. ? Arrange for maternity or paternity leave through employers. ? Arrange for family and friends to take care of pets while you are in the hospital. ? Purchase a rear-facing car seat and make sure you know how to install it in your car. ? Pack your hospital bag. ? Prepare the baby's nursery. Make sure to remove all pillows and stuffed animals from the baby's crib to prevent suffocation.  Visit your dentist if you have not gone during your pregnancy. Use a soft toothbrush to brush your teeth and be gentle when you floss. Contact a health care provider if:  You are unsure if you are in labor or if your water has broken.  You become dizzy.  You have mild pelvic cramps, pelvic  pressure, or nagging pain in your abdominal area.  You have lower back pain.  You have persistent nausea, vomiting, or diarrhea.  You have an unusual or bad smelling vaginal discharge.  You have pain when you urinate. Get help right away if:  Your water breaks before 37 weeks.  You have regular contractions less than 5 minutes apart before 37 weeks.  You have a fever.  You are leaking fluid from your vagina.  You have spotting or bleeding from your vagina.  You have severe abdominal pain or cramping.  You have rapid weight loss or weight gain.  You have shortness of breath with chest pain.  You notice sudden or extreme swelling of your face, hands, ankles, feet, or legs.  Your baby makes fewer than 10 movements in 2 hours.  You have severe headaches that do not go away when you take medicine.  You have vision changes. Summary  The third trimester is from week 28 through week 40, months 7 through 9. The third trimester is a time when the unborn baby (fetus) is growing rapidly.  During the third trimester, your discomfort may increase as you and your baby continue to gain weight. You may have abdominal, leg, and back pain, sleeping problems, and an increased need to urinate.  During the third trimester your breasts will keep growing and they will continue to become tender. A yellow fluid (colostrum) may leak from your breasts. This is the first milk you are producing for your baby.  False labor is a condition in which you feel small, irregular tightenings of the muscles in the womb (contractions) that eventually go away. These are called Braxton Hicks contractions. Contractions may last for hours, days, or even weeks before true labor sets in.  Signs of labor can include: abdominal cramps; regular contractions that start at 10 minutes apart and become stronger and more frequent with time; watery or bloody mucus discharge that comes from the vagina; increased pelvic pressure  and dull back pain; and leaking of amniotic fluid. This information is not intended to replace advice given to you by your health care provider. Make sure you discuss any questions you have with your health care provider. Document Released: 05/22/2001 Document Revised: 11/03/2015 Document Reviewed: 07/29/2012 Elsevier Interactive Patient Education  2017 [employer].

## 2018-04-19 LAB — COMPREHENSIVE METABOLIC PANEL
ALBUMIN: 3.8 g/dL (ref 3.5–5.5)
ALT: 23 IU/L (ref 0–32)
AST: 19 IU/L (ref 0–40)
Albumin/Globulin Ratio: 1.5 (ref 1.2–2.2)
Alkaline Phosphatase: 69 IU/L (ref 39–117)
BUN / CREAT RATIO: 15 (ref 9–23)
BUN: 8 mg/dL (ref 6–20)
Bilirubin Total: 0.3 mg/dL (ref 0.0–1.2)
CALCIUM: 8.9 mg/dL (ref 8.7–10.2)
CO2: 20 mmol/L (ref 20–29)
CREATININE: 0.52 mg/dL — AB (ref 0.57–1.00)
Chloride: 104 mmol/L (ref 96–106)
GFR calc Af Amer: 145 mL/min/{1.73_m2} (ref >59)
GFR calc non Af Amer: 126 mL/min/{1.73_m2} (ref >59)
GLOBULIN, TOTAL: 2.6 g/dL (ref 1.5–4.5)
Glucose: 135 mg/dL — ABNORMAL HIGH (ref 65–99)
Potassium: 4 mmol/L (ref 3.5–5.2)
SODIUM: 137 mmol/L (ref 134–144)
Total Protein: 6.4 g/dL (ref 6.0–8.5)

## 2018-04-19 LAB — CBC
HEMATOCRIT: 32.1 % — AB (ref 34.0–46.6)
Hemoglobin: 11.1 g/dL (ref 11.1–15.9)
MCH: 27.2 pg (ref 26.6–33.0)
MCHC: 34.6 g/dL (ref 31.5–35.7)
MCV: 79 fL (ref 79–97)
PLATELETS: 117 10*3/uL — AB (ref 150–450)
RBC: 4.08 x10E6/uL (ref 3.77–5.28)
RDW: 12.5 % (ref 12.3–15.4)
WBC: 6.6 10*3/uL (ref 3.4–10.8)

## 2018-04-19 LAB — RPR: RPR Ser Ql: NONREACTIVE

## 2018-04-19 LAB — GLUCOSE TOLERANCE, 2 HOURS W/ 1HR
Glucose, 1 hour: 120 mg/dL (ref 65–179)
Glucose, 2 hour: 113 mg/dL (ref 65–152)
Glucose, Fasting: 84 mg/dL (ref 65–91)

## 2018-04-19 LAB — HEPATITIS B DNA, ULTRAQUANTITATIVE, PCR

## 2018-04-19 LAB — HIV ANTIBODY (ROUTINE TESTING W REFLEX): HIV Screen 4th Generation wRfx: NONREACTIVE

## 2018-05-01 ENCOUNTER — Telehealth: Payer: Self-pay | Admitting: *Deleted

## 2018-05-01 NOTE — Telephone Encounter (Signed)
Pt returning call for appointment.  Requesting a call back.

## 2018-05-06 ENCOUNTER — Ambulatory Visit (INDEPENDENT_AMBULATORY_CARE_PROVIDER_SITE_OTHER): Payer: Medicaid Other | Admitting: Advanced Practice Midwife

## 2018-05-06 VITALS — BP 131/84 | HR 88 | Wt 187.0 lb

## 2018-05-06 DIAGNOSIS — O98419 Viral hepatitis complicating pregnancy, unspecified trimester: Secondary | ICD-10-CM

## 2018-05-06 DIAGNOSIS — Z3A31 31 weeks gestation of pregnancy: Secondary | ICD-10-CM

## 2018-05-06 DIAGNOSIS — O34219 Maternal care for unspecified type scar from previous cesarean delivery: Secondary | ICD-10-CM

## 2018-05-06 DIAGNOSIS — B181 Chronic viral hepatitis B without delta-agent: Secondary | ICD-10-CM

## 2018-05-06 DIAGNOSIS — Z98891 History of uterine scar from previous surgery: Secondary | ICD-10-CM

## 2018-05-06 DIAGNOSIS — D696 Thrombocytopenia, unspecified: Secondary | ICD-10-CM

## 2018-05-06 DIAGNOSIS — O98413 Viral hepatitis complicating pregnancy, third trimester: Secondary | ICD-10-CM

## 2018-05-06 NOTE — Progress Notes (Signed)
   PRENATAL VISIT NOTE  Subjective:  Andrea Serrano is a 32 y.o. G4P1021 at [redacted]w[redacted]d being seen today for ongoing prenatal care.  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  Patient reports no complaints.  Contractions: Not present. Vag. Bleeding: None.  Movement: Present. Denies leaking of fluid.   The following portions of the patient's history were reviewed and updated as appropriate: allergies, current medications, past family history, past medical history, past social history, past surgical history and problem list. Problem list updated.  Objective:   Vitals:   05/06/18 1324  BP: 131/84  Pulse: 88  Weight: 187 lb (84.8 kg)    Fetal Status: Fetal Heart Rate (bpm): 148   Movement: Present     General:  Alert, oriented and cooperative. Patient is in no acute distress.  Skin: Skin is warm and dry. No rash noted.   Cardiovascular: Normal heart rate noted  Respiratory: Normal respiratory effort, no problems with respiration noted  Abdomen: Soft, gravid, appropriate for gestational age.  Pain/Pressure: Absent     Pelvic: Cervical exam deferred        Extremities: Normal range of motion.  Edema: None  Mental Status: Normal mood and affect. Normal behavior. Normal judgment and thought content.   Assessment and Plan:  Pregnancy: G4P1021 at [redacted]w[redacted]d  1. Thrombocytopenia (HCC) - Stable, Check in one month  2. History of cesarean section - TOLAC consent signed  3. 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 Please refer to After Visit Summary for other counseling recommendations.  No follow-ups on file.  Future Appointments  Date Time Provider [place]  05/20/2018 10:15 AM Manya Silvas, [place] [facility] [place]  06/05/2018 10:15 AM Starr Lake, [place], [place]

## 2018-05-06 NOTE — Patient Instructions (Signed)
Braxton Hicks Contractions °Contractions of the uterus can occur throughout pregnancy, but they are not always a sign that you are in labor. You may have practice contractions called Braxton Hicks contractions. These false labor contractions are sometimes confused with true labor. °What are Braxton Hicks contractions? °Braxton Hicks contractions are tightening movements that occur in the muscles of the uterus before labor. Unlike true labor contractions, these contractions do not result in opening (dilation) and thinning of the cervix. Toward the end of pregnancy (32-34 weeks), Braxton Hicks contractions can happen more often and may become stronger. These contractions are sometimes difficult to tell apart from true labor because they can be very uncomfortable. You should not feel embarrassed if you go to the hospital with false labor. °Sometimes, the only way to tell if you are in true labor is for your health care provider to look for changes in the cervix. The health care provider will do a physical exam and may monitor your contractions. If you are not in true labor, the exam should show that your cervix is not dilating and your water has not broken. °If there are other health problems associated with your pregnancy, it is completely safe for you to be sent home with false labor. You may continue to have Braxton Hicks contractions until you go into true labor. °How to tell the difference between true labor and false labor °True labor °· Contractions last 30-70 seconds. °· Contractions become very regular. °· Discomfort is usually felt in the top of the uterus, and it spreads to the lower abdomen and low back. °· Contractions do not go away with walking. °· Contractions usually become more intense and increase in frequency. °· The cervix dilates and gets thinner. °False labor °· Contractions are usually shorter and not as strong as true labor contractions. °· Contractions are usually irregular. °· Contractions  are often felt in the front of the lower abdomen and in the groin. °· Contractions may go away when you walk around or change positions while lying down. °· Contractions get weaker and are shorter-lasting as time goes on. °· The cervix usually does not dilate or become thin. °Follow these instructions at home: °· Take over-the-counter and prescription medicines only as told by your health care provider. °· Keep up with your usual exercises and follow other instructions from your health care provider. °· Eat and drink lightly if you think you are going into labor. °· If Braxton Hicks contractions are making you uncomfortable: °? Change your position from lying down or resting to walking, or change from walking to resting. °? Sit and rest in a tub of warm water. °? Drink enough fluid to keep your urine pale yellow. Dehydration may cause these contractions. °? Do slow and deep breathing several times an hour. °· Keep all follow-up prenatal visits as told by your health care provider. This is important. °Contact a health care provider if: °· You have a fever. °· You have continuous pain in your abdomen. °Get help right away if: °· Your contractions become stronger, more regular, and closer together. °· You have fluid leaking or gushing from your vagina. °· You pass blood-tinged mucus (bloody show). °· You have bleeding from your vagina. °· You have low back pain that you never had before. °· You feel your baby’s head pushing down and causing pelvic pressure. °· Your baby is not moving inside you as much as it used to. °Summary °· Contractions that occur before labor are called Braxton   Hicks contractions, false labor, or practice contractions. °· Braxton Hicks contractions are usually shorter, weaker, farther apart, and less regular than true labor contractions. True labor contractions usually become progressively stronger and regular and they become more frequent. °· Manage discomfort from Braxton Hicks contractions by  changing position, resting in a warm bath, drinking plenty of water, or practicing deep breathing. °This information is not intended to replace advice given to you by your health care provider. Make sure you discuss any questions you have with your health care provider. °Document Released: 10/11/2016 Document Revised: 10/11/2016 Document Reviewed: 10/11/2016 °Elsevier Interactive Patient Education © 2018 Elsevier Inc. ° °

## 2018-05-20 ENCOUNTER — Ambulatory Visit (INDEPENDENT_AMBULATORY_CARE_PROVIDER_SITE_OTHER): Payer: Medicaid Other | Admitting: Advanced Practice Midwife

## 2018-05-20 VITALS — BP 119/76 | HR 96 | Wt 189.0 lb

## 2018-05-20 DIAGNOSIS — D696 Thrombocytopenia, unspecified: Secondary | ICD-10-CM

## 2018-05-20 DIAGNOSIS — Z98891 History of uterine scar from previous surgery: Secondary | ICD-10-CM

## 2018-05-20 DIAGNOSIS — O98419 Viral hepatitis complicating pregnancy, unspecified trimester: Principal | ICD-10-CM

## 2018-05-20 DIAGNOSIS — O98413 Viral hepatitis complicating pregnancy, third trimester: Secondary | ICD-10-CM

## 2018-05-20 DIAGNOSIS — Z3483 Encounter for supervision of other normal pregnancy, third trimester: Secondary | ICD-10-CM

## 2018-05-20 DIAGNOSIS — B181 Chronic viral hepatitis B without delta-agent: Secondary | ICD-10-CM

## 2018-05-20 NOTE — Progress Notes (Signed)
   PRENATAL VISIT NOTE  Subjective:  Andrea Serrano is a 32 y.o. G4P1021 at [redacted]w[redacted]d being seen today for ongoing prenatal care.  She is currently monitored for the following issues for this high-risk pregnancy and has Chronic hepatitis B affecting antepartum care of mother [facility]); Thrombocytopenia ([place]); Encounter for supervision of normal pregnancy in multigravida in third trimester; and History of cesarean section on their problem list.  Patient reports no complaints.  Contractions: Not present. Vag. Bleeding: None.  Movement: Present. Denies leaking of fluid.   The following portions of the patient's history were reviewed and updated as appropriate: allergies, current medications, past family history, past medical history, past social history, past surgical history and problem list. Problem list updated.  Objective:   Vitals:   05/20/18 1038  BP: 119/76  Pulse: 96  Weight: 189 lb (85.7 kg)    Fetal Status: Fetal Heart Rate (bpm): 156   Movement: Present     General:  Alert, oriented and cooperative. Patient is in no acute distress.  Skin: Skin is warm and dry. No rash noted.   Cardiovascular: Normal heart rate noted  Respiratory: Normal respiratory effort, no problems with respiration noted  Abdomen: Soft, gravid, appropriate for gestational age.  Pain/Pressure: Absent     Pelvic: Cervical exam deferred        Extremities: Normal range of motion.  Edema: None  Mental Status: Normal mood and affect. Normal behavior. Normal judgment and thought content.   Assessment and Plan:  Pregnancy: G4P1021 at [redacted]w[redacted]d  1. 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 History of cesarean section - Plans TOLAC  3. Thrombocytopenia (HCC) - CBC  4. Encounter for supervision of normal pregnancy in multigravida in third trimester   Preterm labor symptoms and general obstetric precautions including but  not limited to vaginal bleeding, contractions, leaking of fluid and fetal movement were reviewed in detail with the patient. Please refer to After Visit Summary for other counseling recommendations.  Return in about 2 weeks (around 06/03/2018) for ROB.  Future Appointments  Date Time Provider [place]  06/05/2018 10:15 AM Starr Lake, CNM [facility] [place]  06/05/2018 12:45 PM [place] Calista Crain, CNM

## 2018-05-22 LAB — CBC
Hematocrit: 32.2 % — ABNORMAL LOW (ref 34.0–46.6)
Hemoglobin: 10.8 g/dL — ABNORMAL LOW (ref 11.1–15.9)
MCH: 26.3 pg — AB (ref 26.6–33.0)
MCHC: 33.5 g/dL (ref 31.5–35.7)
MCV: 78 fL — ABNORMAL LOW (ref 79–97)
Platelets: 130 10*3/uL — ABNORMAL LOW (ref 150–450)
RBC: 4.11 x10E6/uL (ref 3.77–5.28)
RDW: 12.8 % (ref 12.3–15.4)
WBC: 6.3 10*3/uL (ref 3.4–10.8)

## 2018-05-22 LAB — HEPATITIS B DNA, ULTRAQUANTITATIVE, PCR: HBV DNA SERPL PCR-ACNC: <10 IU/mL

## 2018-06-05 ENCOUNTER — Ambulatory Visit (HOSPITAL_COMMUNITY)
Admission: RE | Admit: 2018-06-05 | Discharge: 2018-06-05 | Disposition: A | Discharge status: Home / Self Care | Payer: Medicaid Other | Source: Ambulatory Visit | Attending: Advanced Practice Midwife | Admitting: Advanced Practice Midwife

## 2018-06-05 ENCOUNTER — Encounter (HOSPITAL_COMMUNITY): Payer: Self-pay

## 2018-06-05 ENCOUNTER — Ambulatory Visit (INDEPENDENT_AMBULATORY_CARE_PROVIDER_SITE_OTHER): Payer: Medicaid Other | Admitting: Student

## 2018-06-05 DIAGNOSIS — B1910 Unspecified viral hepatitis B without hepatic coma: Secondary | ICD-10-CM | POA: Insufficient documentation

## 2018-06-05 DIAGNOSIS — Z3483 Encounter for supervision of other normal pregnancy, third trimester: Secondary | ICD-10-CM

## 2018-06-05 DIAGNOSIS — O99113 Other diseases of the blood and blood-forming organs and certain disorders involving the immune mechanism complicating pregnancy, third trimester: Secondary | ICD-10-CM | POA: Diagnosis not present

## 2018-06-05 DIAGNOSIS — Z362 Encounter for other antenatal screening follow-up: Secondary | ICD-10-CM | POA: Diagnosis not present

## 2018-06-05 DIAGNOSIS — O98413 Viral hepatitis complicating pregnancy, third trimester: Secondary | ICD-10-CM | POA: Diagnosis not present

## 2018-06-05 DIAGNOSIS — B181 Chronic viral hepatitis B without delta-agent: Secondary | ICD-10-CM

## 2018-06-05 DIAGNOSIS — O98419 Viral hepatitis complicating pregnancy, unspecified trimester: Secondary | ICD-10-CM

## 2018-06-05 DIAGNOSIS — O3410 Maternal care for benign tumor of corpus uteri, unspecified trimester: Secondary | ICD-10-CM | POA: Diagnosis not present

## 2018-06-05 DIAGNOSIS — D696 Thrombocytopenia, unspecified: Secondary | ICD-10-CM | POA: Diagnosis not present

## 2018-06-05 DIAGNOSIS — Z3A35 35 weeks gestation of pregnancy: Secondary | ICD-10-CM | POA: Insufficient documentation

## 2018-06-05 DIAGNOSIS — O34219 Maternal care for unspecified type scar from previous cesarean delivery: Secondary | ICD-10-CM

## 2018-06-05 DIAGNOSIS — Z98891 History of uterine scar from previous surgery: Secondary | ICD-10-CM

## 2018-06-05 DIAGNOSIS — O3413 Maternal care for benign tumor of corpus uteri, third trimester: Secondary | ICD-10-CM

## 2018-06-05 NOTE — Patient Instructions (Signed)
Breastfeeding and Human Lactation (4th ed., pp. 262-299). [place], [place]: [employer].">   Breastfeeding and Mastitis    Mastitis is inflammation of the breast tissue. It can occur in women who are breastfeeding. This can make breastfeeding painful. Mastitis will sometimes go away on its own, especially if it is not caused by an infection (non-infectious mastitis). Your health care provider will help determine if medical treatment is needed. Treatment may be needed if the condition is caused by a bacterial infection (infectious mastitis).  What are the causes?  This condition is often associated with a blocked milkduct, which can happen when too much milk builds up in the breast. Causes of excess milk in the breast can include:  Poor latch-on. If your baby is not latched onto the breast properly, he or she may not empty your breast completely while breastfeeding.  Allowing too much time to pass between feedings.  Wearing a bra or other clothing that is too tight. This puts extra pressure on the milk ducts so milk does not flow through them as it should.  Milk remaining in the breast because it is overfilled (engorged).  Stress and fatigue.  Mastitis can also be caused by a bacterial infection. Bacteria may enter the breast tissue through cuts, cracks, or openings in the skin near the nipple area. Cracks in the skin are often caused when your baby does not latch on properly to the breast.  What are the signs or symptoms?  Symptoms of this condition include:  Swelling, redness, tenderness, and pain in an area of the breast. This usually affects the upper part of the breast, toward the armpit region. In most cases, it affects only one breast. In some cases, it may occur on both breasts at the same time and affect a larger portion of breast tissue.  Swelling of the glands under the arm on the same side.  Fatigue, headache, and flu-like muscle aches.  Fever.  Rapid pulse.  Symptoms usually last 2 to 5  days. Breast pain and redness are at their worst on day 2 and day 3, and they usually go away by day 5. If an infection is left to progress, a collection of pus (abscess) may develop.  How is this diagnosed?  This condition can be diagnosed based on your symptoms and a physical exam. You may also have tests, such as:  Blood tests to determine if your body is fighting a bacterial infection.  Mammogram or ultrasound tests to rule out other problems or diseases.  Fluid tests. If an abscess has developed, the fluid in the abscess may be removed with a needle. The fluid may be analyzed to determine if bacteria are present.  Breast milk may be cultured and tested for bacteria.  How is this treated?  This condition will sometimes go away on its own. Your health care provider may choose to wait 24 hours after first seeing you to decide whether treatment is needed. If treatment is needed, it may include:  Strategies to manage breastfeeding. This includes continuing to breastfeed or pump in order to allow adequate milk flow, using breast massage, and applying heat or cold to the affected area.  Self-care such as rest and increased fluid intake.  Medicine for pain.  Antibiotic medicine to treat a bacterial infection. This is usually taken by mouth.  If an abscess has developed, it may be treated by removing fluid with a needle.  Follow these instructions at home:  Medicines  Take   over-the-counter and prescription medicines only as told by your health care provider.  If you were prescribed an antibiotic medicine, take it as told by your health care provider. Do not stop taking the antibiotic even if you start to feel better.  General instructions  Do not wear a tight or underwire bra. Wear a soft, supportive bra.  Increase your fluid intake, especially if you have a fever.  Get plenty of rest.  For breastfeeding:  Continue to empty your breasts as often as possible, either by breastfeeding or using an electric breast pump. This  will lower the pressure and the pain that comes with it. Ask your health care provider if changes need to be made to your breastfeeding or pumping routine.  Keep your nipples clean and dry.  During breastfeeding, empty the first breast completely before going to the other breast. If your baby is not emptying your breasts completely, use a breast pump to empty your breasts.  Use breast massage during feeding or pumping sessions.  If directed, apply moist heat to the affected area of your breast right before breastfeeding or pumping. Use the heat source that your health care provider recommends.  If directed, put ice on the affected area of your breast right after breastfeeding or pumping:  Put ice in a plastic bag.  Place a towel between your skin and the bag.  Leave the ice on for 20 minutes.  If you go back to work, pump your breasts while at work to stay in time with your nursing schedule.  Do not allow your breasts to become engorged.  Contact a health care provider if:  You have pus-like discharge from the breast.  You have a fever.  Your symptoms do not improve within 2 days of starting treatment.  Your symptoms return after you have recovered from a breast infection.  Get help right away if:  Your pain and swelling are getting worse.  You have pain that is not controlled with medicine.  You have a red line extending from the breast toward your armpit.  Summary  Mastitis is inflammation of the breast tissue. It is often caused by a blocked milk duct or bacteria.  This condition may be treated with hot and cold compresses, medicines, self-care, and certain breastfeeding strategies.  If you were prescribed an antibiotic medicine, take it as told by your health care provider. Do not stop taking the antibiotic even if you start to feel better.  Continue to empty your breasts as often as possible either by breastfeeding or using an electric breast pump.  This information is not intended to replace advice given to  you by your health care provider. Make sure you discuss any questions you have with your health care provider.  Document Released: 09/22/2004 Document Revised: 02/14/2018 Document Reviewed: 05/29/2016  Elsevier Interactive Patient Education © 2019 Elsevier Inc.

## 2018-06-05 NOTE — Progress Notes (Signed)
Patient ID: Andrea Serrano, female   DOB: May 05, 1986, 32 y.o.   MRN: [id number]   PRENATAL VISIT NOTE  Subjective:  Andrea Serrano is a 32 y.o. G4P1021 at [redacted]w[redacted]d being seen today for ongoing prenatal care.  She is currently monitored for the following issues for this high-risk pregnancy and has Chronic hepatitis B affecting antepartum care of mother Andrea Serrano); Thrombocytopenia ([place]); Encounter for supervision of normal pregnancy in multigravida in third trimester; and History of cesarean section on their problem list.  Patient reports no complaints.  Contractions: Not present. Vag. Bleeding: None.  Movement: Present. Denies leaking of fluid.   The following portions of the patient's history were reviewed and updated as appropriate: allergies, current medications, past family history, past medical history, past social history, past surgical history and problem list. Problem list updated.  Objective:   Vitals:   06/05/18 1025  BP: 117/73  Pulse: 88  Weight: 191 lb (86.6 kg)    Fetal Status: Fetal Heart Rate (bpm): 141   Movement: Present     General:  Alert, oriented and cooperative. Patient is in no acute distress.  Skin: Skin is warm and dry. No rash noted.   Cardiovascular: Normal heart rate noted  Respiratory: Normal respiratory effort, no problems with respiration noted  Abdomen: Soft, gravid, appropriate for gestational age.  Pain/Pressure: Present     Pelvic: Cervical exam deferred        Extremities: Normal range of motion.  Edema: None  Mental Status: Normal mood and affect. Normal behavior. Normal judgment and thought content.   Assessment and Plan:  Pregnancy: G4P1021 at [redacted]w[redacted]d  1. Encounter for supervision of normal pregnancy in multigravida in third trimester -reviewed up to date protocol regarding Hep B; since Viral load so low there is no need to repeat labs now.  -baby will get vaccines 12 hours PP; mother is aware.  -will check platelets in January -patient will have  growth scan today (has fibroids).  Preterm labor symptoms and general obstetric precautions including but not limited to vaginal bleeding, contractions, leaking of fluid and fetal movement were reviewed in detail with the patient. Please refer to After Visit Summary for other counseling recommendations.  Return in about 1 week (around 06/12/2018), or LROB.  Future Appointments  Date Time Provider [place]  06/05/2018 12:45 PM WH-MFC [place] 2 WH-MFCUS MFC-US    Emmajane Altamura Lorraine Allizon Woznick, CNM

## 2018-06-09 ENCOUNTER — Encounter: Payer: Self-pay | Admitting: Advanced Practice Midwife

## 2018-06-09 DIAGNOSIS — D259 Leiomyoma of uterus, unspecified: Secondary | ICD-10-CM | POA: Insufficient documentation

## 2018-06-09 DIAGNOSIS — O3410 Maternal care for benign tumor of corpus uteri, unspecified trimester: Secondary | ICD-10-CM

## 2018-06-18 ENCOUNTER — Other Ambulatory Visit (HOSPITAL_COMMUNITY)
Admission: RE | Admit: 2018-06-18 | Discharge: 2018-06-18 | Disposition: A | Discharge status: Home / Self Care | Payer: Medicaid Other | Source: Ambulatory Visit | Attending: Medical | Admitting: Medical

## 2018-06-18 ENCOUNTER — Encounter: Payer: Self-pay | Admitting: Medical

## 2018-06-18 ENCOUNTER — Ambulatory Visit (INDEPENDENT_AMBULATORY_CARE_PROVIDER_SITE_OTHER): Payer: Medicaid Other | Admitting: Medical

## 2018-06-18 VITALS — BP 121/75 | HR 81 | Wt 190.0 lb

## 2018-06-18 DIAGNOSIS — Z3483 Encounter for supervision of other normal pregnancy, third trimester: Secondary | ICD-10-CM | POA: Insufficient documentation

## 2018-06-18 DIAGNOSIS — O98419 Viral hepatitis complicating pregnancy, unspecified trimester: Secondary | ICD-10-CM

## 2018-06-18 DIAGNOSIS — Z3A37 37 weeks gestation of pregnancy: Secondary | ICD-10-CM

## 2018-06-18 DIAGNOSIS — O98413 Viral hepatitis complicating pregnancy, third trimester: Secondary | ICD-10-CM

## 2018-06-18 DIAGNOSIS — Z98891 History of uterine scar from previous surgery: Secondary | ICD-10-CM

## 2018-06-18 DIAGNOSIS — B181 Chronic viral hepatitis B without delta-agent: Secondary | ICD-10-CM

## 2018-06-18 DIAGNOSIS — D696 Thrombocytopenia, unspecified: Secondary | ICD-10-CM

## 2018-06-18 LAB — CBC
Hematocrit: 33.1 % — ABNORMAL LOW (ref 34.0–46.6)
Hemoglobin: 11 g/dL — ABNORMAL LOW (ref 11.1–15.9)
MCH: 25.6 pg — AB (ref 26.6–33.0)
MCHC: 33.2 g/dL (ref 31.5–35.7)
MCV: 77 fL — ABNORMAL LOW (ref 79–97)
Platelets: 152 10*3/uL (ref 150–450)
RBC: 4.29 x10E6/uL (ref 3.77–5.28)
RDW: 13.4 % (ref 11.7–15.4)
WBC: 6.2 10*3/uL (ref 3.4–10.8)

## 2018-06-18 LAB — OB RESULTS CONSOLE GBS: GBS: NEGATIVE

## 2018-06-18 NOTE — Progress Notes (Signed)
   PRENATAL VISIT NOTE  Subjective:  Andrea Serrano is a 33 y.o. G4P1021 at [redacted]w[redacted]d being seen today for ongoing prenatal care.  She is currently monitored for the following issues for this high-risk pregnancy and has Chronic hepatitis B affecting antepartum care of mother [facility]); Thrombocytopenia ([place]); Encounter for supervision of normal pregnancy in multigravida in third trimester; History of cesarean section; and Uterine fibroid in pregnancy on their problem list.  Patient reports no complaints.  Contractions: Irritability. Vag. Bleeding: None.  Movement: Present. Denies leaking of fluid.   The following portions of the patient's history were reviewed and updated as appropriate: allergies, current medications, past family history, past medical history, past social history, past surgical history and problem list. Problem list updated.  Objective:   Vitals:   06/18/18 0910  BP: 121/75  Pulse: 81  Weight: 190 lb (86.2 kg)    Fetal Status: Fetal Heart Rate (bpm): 140 Fundal Height: 37 cm Movement: Present  Presentation: Vertex  General:  Alert, oriented and cooperative. Patient is in no acute distress.  Skin: Skin is warm and dry. No rash noted.   Cardiovascular: Normal heart rate noted  Respiratory: Normal respiratory effort, no problems with respiration noted  Abdomen: Soft, gravid, appropriate for gestational age.  Pain/Pressure: Present     Pelvic: Cervical exam performed Dilation: 1 Effacement (%): 20 Station: -3  Extremities: Normal range of motion.  Edema: None  Mental Status: Normal mood and affect. Normal behavior. Normal judgment and thought content.   Assessment and Plan:  Pregnancy: G4P1021 at [redacted]w[redacted]d  1. Encounter for supervision of normal pregnancy in multigravida in third trimester - Culture, beta strep (group b only) - Cervicovaginal ancillary only( [place])  2. Thrombocytopenia ([facility]) - CBC  3. History of cesarean section - Planning TOLAC - consent signed  previously   4. Chronic hepatitis B affecting antepartum care of mother [facility])   Term labor symptoms and general obstetric precautions including but not limited to vaginal bleeding, contractions, leaking of fluid and fetal movement were reviewed in detail with the patient. Please refer to After Visit Summary for other counseling recommendations.  Return in about 1 week (around 06/25/2018) for LOB.   Kerry Hough, PA-C

## 2018-06-18 NOTE — Patient Instructions (Signed)
Fetal Movement Counts Patient Name: ________________________________________________ Patient Due Date: ____________________ What is a fetal movement count?  A fetal movement count is the number of times that you feel your baby move during a certain amount of time. This may also be called a fetal kick count. A fetal movement count is recommended for every pregnant woman. You may be asked to start counting fetal movements as early as week 28 of your pregnancy. Pay attention to when your baby is most active. You may notice your baby's sleep and wake cycles. You may also notice things that make your baby move more. You should do a fetal movement count:  When your baby is normally most active.  At the same time each day. A good time to count movements is while you are resting, after having something to eat and drink. How do I count fetal movements? 1. Find a quiet, comfortable area. Sit, or lie down on your side. 2. Write down the date, the start time and stop time, and the number of movements that you felt between those two times. Take this information with you to your health care visits. 3. For 2 hours, count kicks, flutters, swishes, rolls, and jabs. You should feel at least 10 movements during 2 hours. 4. You may stop counting after you have felt 10 movements. 5. If you do not feel 10 movements in 2 hours, have something to eat and drink. Then, keep resting and counting for 1 hour. If you feel at least 4 movements during that hour, you may stop counting. Contact a health care provider if:  You feel fewer than 4 movements in 2 hours.  Your baby is not moving like he or she usually does. Date: ____________ Start time: ____________ Stop time: ____________ Movements: ____________ Date: ____________ Start time: ____________ Stop time: ____________ Movements: ____________ Date: ____________ Start time: ____________ Stop time: ____________ Movements: ____________ Date: ____________ Start time:  ____________ Stop time: ____________ Movements: ____________ Date: ____________ Start time: ____________ Stop time: ____________ Movements: ____________ Date: ____________ Start time: ____________ Stop time: ____________ Movements: ____________ Date: ____________ Start time: ____________ Stop time: ____________ Movements: ____________ Date: ____________ Start time: ____________ Stop time: ____________ Movements: ____________ Date: ____________ Start time: ____________ Stop time: ____________ Movements: ____________ This information is not intended to replace advice given to you by your health care provider. Make sure you discuss any questions you have with your health care provider. Document Released: 06/27/2006 Document Revised: 01/25/2016 Document Reviewed: 07/07/2015 Elsevier Interactive Patient Education  2019 Elsevier Inc. Braxton Hicks Contractions Contractions of the uterus can occur throughout pregnancy, but they are not always a sign that you are in labor. You may have practice contractions called Braxton Hicks contractions. These false labor contractions are sometimes confused with true labor. What are Braxton Hicks contractions? Braxton Hicks contractions are tightening movements that occur in the muscles of the uterus before labor. Unlike true labor contractions, these contractions do not result in opening (dilation) and thinning of the cervix. Toward the end of pregnancy (32-34 weeks), Braxton Hicks contractions can happen more often and may become stronger. These contractions are sometimes difficult to tell apart from true labor because they can be very uncomfortable. You should not feel embarrassed if you go to the hospital with false labor. Sometimes, the only way to tell if you are in true labor is for your health care provider to look for changes in the cervix. The health care provider will do a physical exam and may monitor your contractions. If   you are not in true labor, the exam  should show that your cervix is not dilating and your water has not broken. If there are no other health problems associated with your pregnancy, it is completely safe for you to be sent home with false labor. You may continue to have Braxton Hicks contractions until you go into true labor. How to tell the difference between true labor and false labor True labor  Contractions last 30-70 seconds.  Contractions become very regular.  Discomfort is usually felt in the top of the uterus, and it spreads to the lower abdomen and low back.  Contractions do not go away with walking.  Contractions usually become more intense and increase in frequency.  The cervix dilates and gets thinner. False labor  Contractions are usually shorter and not as strong as true labor contractions.  Contractions are usually irregular.  Contractions are often felt in the front of the lower abdomen and in the groin.  Contractions may go away when you walk around or change positions while lying down.  Contractions get weaker and are shorter-lasting as time goes on.  The cervix usually does not dilate or become thin. Follow these instructions at home:   Take over-the-counter and prescription medicines only as told by your health care provider.  Keep up with your usual exercises and follow other instructions from your health care provider.  Eat and drink lightly if you think you are going into labor.  If Braxton Hicks contractions are making you uncomfortable: ? Change your position from lying down or resting to walking, or change from walking to resting. ? Sit and rest in a tub of warm water. ? Drink enough fluid to keep your urine pale yellow. Dehydration may cause these contractions. ? Do slow and deep breathing several times an hour.  Keep all follow-up prenatal visits as told by your health care provider. This is important. Contact a health care provider if:  You have a fever.  You have continuous  pain in your abdomen. Get help right away if:  Your contractions become stronger, more regular, and closer together.  You have fluid leaking or gushing from your vagina.  You pass blood-tinged mucus (bloody show).  You have bleeding from your vagina.  You have low back pain that you never had before.  You feel your baby's head pushing down and causing pelvic pressure.  Your baby is not moving inside you as much as it used to. Summary  Contractions that occur before labor are called Braxton Hicks contractions, false labor, or practice contractions.  Braxton Hicks contractions are usually shorter, weaker, farther apart, and less regular than true labor contractions. True labor contractions usually become progressively stronger and regular, and they become more frequent.  Manage discomfort from Braxton Hicks contractions by changing position, resting in a warm bath, drinking plenty of water, or practicing deep breathing. This information is not intended to replace advice given to you by your health care provider. Make sure you discuss any questions you have with your health care provider. Document Released: 10/11/2016 Document Revised: 03/12/2017 Document Reviewed: 10/11/2016 Elsevier Interactive Patient Education  2019 Elsevier Inc.  

## 2018-06-19 LAB — CERVICOVAGINAL ANCILLARY ONLY
CHLAMYDIA, DNA PROBE: NEGATIVE
Neisseria Gonorrhea: NEGATIVE

## 2018-06-22 LAB — CULTURE, BETA STREP (GROUP B ONLY): Strep Gp B Culture: NEGATIVE

## 2018-06-25 ENCOUNTER — Ambulatory Visit (INDEPENDENT_AMBULATORY_CARE_PROVIDER_SITE_OTHER): Payer: Medicaid Other | Admitting: Nurse Practitioner

## 2018-06-25 VITALS — BP 118/72 | HR 79 | Wt 192.2 lb

## 2018-06-25 DIAGNOSIS — O98413 Viral hepatitis complicating pregnancy, third trimester: Secondary | ICD-10-CM

## 2018-06-25 DIAGNOSIS — Z3483 Encounter for supervision of other normal pregnancy, third trimester: Secondary | ICD-10-CM

## 2018-06-25 DIAGNOSIS — B181 Chronic viral hepatitis B without delta-agent: Secondary | ICD-10-CM

## 2018-06-25 DIAGNOSIS — Z98891 History of uterine scar from previous surgery: Secondary | ICD-10-CM

## 2018-06-25 DIAGNOSIS — D696 Thrombocytopenia, unspecified: Secondary | ICD-10-CM

## 2018-06-25 DIAGNOSIS — O98419 Viral hepatitis complicating pregnancy, unspecified trimester: Secondary | ICD-10-CM

## 2018-06-25 DIAGNOSIS — Z3A38 38 weeks gestation of pregnancy: Secondary | ICD-10-CM

## 2018-06-25 NOTE — Progress Notes (Signed)
    Subjective:  Andrea Serrano is a 33 y.o. G4P1021 at [redacted]w[redacted]d being seen today for ongoing prenatal care.  She is currently monitored for the following issues for this high-risk pregnancy and has Chronic hepatitis B affecting antepartum care of mother [facility]); Thrombocytopenia ([place]); Encounter for supervision of normal pregnancy in multigravida in third trimester; History of cesarean section; and Uterine fibroid in pregnancy on their problem list.  Patient reports fatigue.  Contractions: Irritability. Vag. Bleeding: None.  Movement: Present. Denies leaking of fluid.   The following portions of the patient's history were reviewed and updated as appropriate: allergies, current medications, past family history, past medical history, past social history, past surgical history and problem list. Problem list updated.  Objective:   Vitals:   06/25/18 0829  BP: 118/72  Pulse: 79  Weight: 192 lb 3.2 oz (87.2 kg)    Fetal Status: Fetal Heart Rate (bpm): 134 Fundal Height: 40 cm Movement: Present  Presentation: Vertex  General:  Alert, oriented and cooperative. Patient is in no acute distress.  Skin: Skin is warm and dry. No rash noted.   Cardiovascular: Normal heart rate noted  Respiratory: Normal respiratory effort, no problems with respiration noted  Abdomen: Soft, gravid, appropriate for gestational age. Pain/Pressure: Present     Pelvic:  Cervical exam performed Dilation: Closed   Station: -3  Extremities: Normal range of motion.  Edema: None  Mental Status: Normal mood and affect. Normal behavior. Normal judgment and thought content.   Urinalysis:      Assessment and Plan:  Pregnancy: G4P1021 at [redacted]w[redacted]d  1. Encounter for supervision of normal pregnancy in multigravida in third trimester Reviewed pelvic rocking and dancing to move baby into birth canal better - ballotable today and not in the pelvis  2. Thrombocytopenia ([place]) Latest CBC at last visit - platelets were 152 - normal  range  3. History of cesarean section Planning VBAC - not having many contractions at this time  4. Chronic hepatitis B affecting antepartum care of mother [facility])   Term labor symptoms and general obstetric precautions including but not limited to vaginal bleeding, contractions, leaking of fluid and fetal movement were reviewed in detail with the patient. Please refer to After Visit Summary for other counseling recommendations.  Return in about 1 week (around 07/02/2018).  Earlie Server, RN, MSN, NP-BC Nurse Practitioner, [facility] for [employer], [facility] 06/25/2018 8:49 AM

## 2018-06-25 NOTE — Patient Instructions (Addendum)

## 2018-07-02 ENCOUNTER — Ambulatory Visit (INDEPENDENT_AMBULATORY_CARE_PROVIDER_SITE_OTHER): Payer: Medicaid Other | Admitting: Nurse Practitioner

## 2018-07-02 ENCOUNTER — Encounter: Payer: Self-pay | Admitting: Nurse Practitioner

## 2018-07-02 VITALS — BP 118/78 | HR 82 | Wt 191.0 lb

## 2018-07-02 DIAGNOSIS — Z98891 History of uterine scar from previous surgery: Secondary | ICD-10-CM

## 2018-07-02 DIAGNOSIS — B181 Chronic viral hepatitis B without delta-agent: Secondary | ICD-10-CM

## 2018-07-02 DIAGNOSIS — Z3483 Encounter for supervision of other normal pregnancy, third trimester: Secondary | ICD-10-CM

## 2018-07-02 DIAGNOSIS — O98419 Viral hepatitis complicating pregnancy, unspecified trimester: Secondary | ICD-10-CM

## 2018-07-02 NOTE — Addendum Note (Signed)
Addended by: Virginia Rochester on: 07/02/2018 09:39 PM   Modules accepted: Orders, SmartSet

## 2018-07-02 NOTE — Progress Notes (Signed)
    Subjective:  Andrea Serrano is a 33 y.o. [id number] at [redacted]w[redacted]d being seen today for ongoing prenatal care.  She is currently monitored for the following issues for this high-risk pregnancy and has Chronic hepatitis B affecting antepartum care of mother Andrea Serrano); Thrombocytopenia (Andrea Serrano); Encounter for supervision of normal pregnancy in multigravida in third trimester; History of cesarean section; and Uterine fibroid in pregnancy on their problem list.  Patient reports no complaints.  Contractions: Irritability. Vag. Bleeding: None.  Movement: Present. Denies leaking of fluid.   The following portions of the patient's history were reviewed and updated as appropriate: allergies, current medications, past family history, past medical history, past social history, past surgical history and problem list. Problem list updated.  Objective:   Vitals:   07/02/18 1458  BP: 118/78  Pulse: 82  Weight: 191 lb (86.6 kg)    Fetal Status: Fetal Heart Rate (bpm): 140 Fundal Height: 39 cm Movement: Present     General:  Alert, oriented and cooperative. Patient is in no acute distress.  Skin: Skin is warm and dry. No rash noted.   Cardiovascular: Normal heart rate noted  Respiratory: Normal respiratory effort, no problems with respiration noted  Abdomen: Soft, gravid, appropriate for gestational age. Pain/Pressure: Present     Pelvic:  Cervical exam deferred        Extremities: Normal range of motion.  Edema: None  Mental Status: Normal mood and affect. Normal behavior. Normal judgment and thought content.   Urinalysis:      Assessment and Plan:  Pregnancy: G4P1021 at [redacted]w[redacted]d  1. Encounter for supervision of normal pregnancy in multigravida in third trimester Scheduled for induction at [redacted]w[redacted]d Will check next visit to see if appropriate for foley bulb insertion Discussed foley bulb with her today  2. History of cesarean section TOLAC planned  3. Chronic hepatitis B affecting antepartum care of mother  Andrea Serrano)   Term labor symptoms and general obstetric precautions including but not limited to vaginal bleeding, contractions, leaking of fluid and fetal movement were reviewed in detail with the patient. Please refer to After Visit Summary for other counseling recommendations.  Return in about 1 week (around 07/09/2018) for will need cervical exam to decide about foley bulb.  Earlie Server, RN, MSN, NP-BC Nurse Practitioner, [facility] for Andrea Serrano, Andrea Serrano 07/02/2018 3:23 PM

## 2018-07-02 NOTE — Patient Instructions (Signed)

## 2018-07-03 ENCOUNTER — Telehealth (HOSPITAL_COMMUNITY): Payer: Self-pay | Admitting: *Deleted

## 2018-07-03 NOTE — Telephone Encounter (Signed)
Preadmission screen  

## 2018-07-09 ENCOUNTER — Ambulatory Visit (INDEPENDENT_AMBULATORY_CARE_PROVIDER_SITE_OTHER): Payer: Medicaid Other | Admitting: Medical

## 2018-07-09 ENCOUNTER — Ambulatory Visit: Payer: Self-pay

## 2018-07-09 ENCOUNTER — Encounter: Payer: Self-pay | Admitting: Medical

## 2018-07-09 VITALS — BP 117/76 | HR 88 | Wt 193.1 lb

## 2018-07-09 DIAGNOSIS — Z3483 Encounter for supervision of other normal pregnancy, third trimester: Secondary | ICD-10-CM | POA: Diagnosis not present

## 2018-07-09 DIAGNOSIS — O480 Post-term pregnancy: Secondary | ICD-10-CM | POA: Diagnosis not present

## 2018-07-09 DIAGNOSIS — Z98891 History of uterine scar from previous surgery: Secondary | ICD-10-CM | POA: Diagnosis not present

## 2018-07-09 NOTE — Progress Notes (Signed)
   PRENATAL VISIT NOTE  Subjective:  Andrea Serrano is a 33 y.o. [id number] at [redacted]w[redacted]d being seen today for ongoing prenatal care.  She is currently monitored for the following issues for this high-risk pregnancy and has Chronic hepatitis B affecting antepartum care of mother [facility]); Thrombocytopenia ([place]); Encounter for supervision of normal pregnancy in multigravida in third trimester; History of cesarean section; and Uterine fibroid in pregnancy on their problem list.  Patient reports no complaints.  Contractions: Not present. Vag. Bleeding: None.  Movement: Present. Denies leaking of fluid.   The following portions of the patient's history were reviewed and updated as appropriate: allergies, current medications, past family history, past medical history, past social history, past surgical history and problem list. Problem list updated.  Objective:   Vitals:   07/09/18 1550  BP: 117/76  Pulse: 88  Weight: 193 lb 1.6 oz (87.6 kg)    Fetal Status: Fetal Heart Rate (bpm): 140 Fundal Height: 40 cm Movement: Present  Presentation: Vertex  General:  Alert, oriented and cooperative. Patient is in no acute distress.  Skin: Skin is warm and dry. No rash noted.   Cardiovascular: Normal heart rate noted  Respiratory: Normal respiratory effort, no problems with respiration noted  Abdomen: Soft, gravid, appropriate for gestational age.  Pain/Pressure: Present     Pelvic: Cervical exam performed Dilation: Closed Effacement (%): 20 Station: -3  Extremities: Normal range of motion.  Edema: None  Mental Status: Normal mood and affect. Normal behavior. Normal judgment and thought content.    Fetal Monitoring: Baseline: 130 bpm Variability: moderate Accelerations: 15 x 15 Decelerations: none Contractions: none  Assessment and Plan:  Pregnancy: G4P1021 at [redacted]w[redacted]d  1. Encounter for supervision of normal pregnancy in multigravida in third trimester - NST today for post dates - Cervix closed, patient  is not a candidate for OP Foley  - IOL already scheduled for next week  - Reactive NST today   2. History of cesarean section - TOLAC consent signed previously   Term labor symptoms and general obstetric precautions including but not limited to vaginal bleeding, contractions, leaking of fluid and fetal movement were reviewed in detail with the patient. Please refer to After Visit Summary for other counseling recommendations.  Return in about 1 month (around 08/11/2018) for PP visit.  Future Appointments  Date Time Provider [place]  07/14/2018  6:30 AM [facility] ROOM [facility] None    Kerry Hough, [place]

## 2018-07-09 NOTE — Patient Instructions (Signed)
Fetal Movement Counts Patient Name: ________________________________________________ Patient Due Date: ____________________ What is a fetal movement count?  A fetal movement count is the number of times that you feel your baby move during a certain amount of time. This may also be called a fetal kick count. A fetal movement count is recommended for every pregnant woman. You may be asked to start counting fetal movements as early as week 28 of your pregnancy. Pay attention to when your baby is most active. You may notice your baby's sleep and wake cycles. You may also notice things that make your baby move more. You should do a fetal movement count:  When your baby is normally most active.  At the same time each day. A good time to count movements is while you are resting, after having something to eat and drink. How do I count fetal movements? 1. Find a quiet, comfortable area. Sit, or lie down on your side. 2. Write down the date, the start time and stop time, and the number of movements that you felt between those two times. Take this information with you to your health care visits. 3. For 2 hours, count kicks, flutters, swishes, rolls, and jabs. You should feel at least 10 movements during 2 hours. 4. You may stop counting after you have felt 10 movements. 5. If you do not feel 10 movements in 2 hours, have something to eat and drink. Then, keep resting and counting for 1 hour. If you feel at least 4 movements during that hour, you may stop counting. Contact a health care provider if:  You feel fewer than 4 movements in 2 hours.  Your baby is not moving like he or she usually does. Date: ____________ Start time: ____________ Stop time: ____________ Movements: ____________ Date: ____________ Start time: ____________ Stop time: ____________ Movements: ____________ Date: ____________ Start time: ____________ Stop time: ____________ Movements: ____________ Date: ____________ Start time:  ____________ Stop time: ____________ Movements: ____________ Date: ____________ Start time: ____________ Stop time: ____________ Movements: ____________ Date: ____________ Start time: ____________ Stop time: ____________ Movements: ____________ Date: ____________ Start time: ____________ Stop time: ____________ Movements: ____________ Date: ____________ Start time: ____________ Stop time: ____________ Movements: ____________ Date: ____________ Start time: ____________ Stop time: ____________ Movements: ____________ This information is not intended to replace advice given to you by your health care provider. Make sure you discuss any questions you have with your health care provider. Document Released: 06/27/2006 Document Revised: 01/25/2016 Document Reviewed: 07/07/2015 Elsevier Interactive Patient Education  2019 Elsevier Inc. Braxton Hicks Contractions Contractions of the uterus can occur throughout pregnancy, but they are not always a sign that you are in labor. You may have practice contractions called Braxton Hicks contractions. These false labor contractions are sometimes confused with true labor. What are Braxton Hicks contractions? Braxton Hicks contractions are tightening movements that occur in the muscles of the uterus before labor. Unlike true labor contractions, these contractions do not result in opening (dilation) and thinning of the cervix. Toward the end of pregnancy (32-34 weeks), Braxton Hicks contractions can happen more often and may become stronger. These contractions are sometimes difficult to tell apart from true labor because they can be very uncomfortable. You should not feel embarrassed if you go to the hospital with false labor. Sometimes, the only way to tell if you are in true labor is for your health care provider to look for changes in the cervix. The health care provider will do a physical exam and may monitor your contractions. If   you are not in true labor, the exam  should show that your cervix is not dilating and your water has not broken. If there are no other health problems associated with your pregnancy, it is completely safe for you to be sent home with false labor. You may continue to have Braxton Hicks contractions until you go into true labor. How to tell the difference between true labor and false labor True labor  Contractions last 30-70 seconds.  Contractions become very regular.  Discomfort is usually felt in the top of the uterus, and it spreads to the lower abdomen and low back.  Contractions do not go away with walking.  Contractions usually become more intense and increase in frequency.  The cervix dilates and gets thinner. False labor  Contractions are usually shorter and not as strong as true labor contractions.  Contractions are usually irregular.  Contractions are often felt in the front of the lower abdomen and in the groin.  Contractions may go away when you walk around or change positions while lying down.  Contractions get weaker and are shorter-lasting as time goes on.  The cervix usually does not dilate or become thin. Follow these instructions at home:   Take over-the-counter and prescription medicines only as told by your health care provider.  Keep up with your usual exercises and follow other instructions from your health care provider.  Eat and drink lightly if you think you are going into labor.  If Braxton Hicks contractions are making you uncomfortable: ? Change your position from lying down or resting to walking, or change from walking to resting. ? Sit and rest in a tub of warm water. ? Drink enough fluid to keep your urine pale yellow. Dehydration may cause these contractions. ? Do slow and deep breathing several times an hour.  Keep all follow-up prenatal visits as told by your health care provider. This is important. Contact a health care provider if:  You have a fever.  You have continuous  pain in your abdomen. Get help right away if:  Your contractions become stronger, more regular, and closer together.  You have fluid leaking or gushing from your vagina.  You pass blood-tinged mucus (bloody show).  You have bleeding from your vagina.  You have low back pain that you never had before.  You feel your baby's head pushing down and causing pelvic pressure.  Your baby is not moving inside you as much as it used to. Summary  Contractions that occur before labor are called Braxton Hicks contractions, false labor, or practice contractions.  Braxton Hicks contractions are usually shorter, weaker, farther apart, and less regular than true labor contractions. True labor contractions usually become progressively stronger and regular, and they become more frequent.  Manage discomfort from Braxton Hicks contractions by changing position, resting in a warm bath, drinking plenty of water, or practicing deep breathing. This information is not intended to replace advice given to you by your health care provider. Make sure you discuss any questions you have with your health care provider. Document Released: 10/11/2016 Document Revised: 03/12/2017 Document Reviewed: 10/11/2016 Elsevier Interactive Patient Education  2019 Elsevier Inc.  

## 2018-07-14 ENCOUNTER — Encounter (HOSPITAL_COMMUNITY): Payer: Self-pay

## 2018-07-14 ENCOUNTER — Other Ambulatory Visit: Payer: Self-pay

## 2018-07-14 ENCOUNTER — Inpatient Hospital Stay (HOSPITAL_COMMUNITY): Payer: Medicaid Other | Admitting: Anesthesiology

## 2018-07-14 ENCOUNTER — Inpatient Hospital Stay (HOSPITAL_COMMUNITY)
Admission: RE | Admit: 2018-07-14 | Discharge: 2018-07-17 | DRG: 786 | Disposition: A | Discharge status: Home / Self Care | Payer: Medicaid Other | Attending: Obstetrics and Gynecology | Admitting: Obstetrics and Gynecology

## 2018-07-14 VITALS — BP 137/78 | HR 64 | Temp 99.6°F | Resp 18 | Ht 63.0 in | Wt 194.9 lb

## 2018-07-14 DIAGNOSIS — O411230 Chorioamnionitis, third trimester, not applicable or unspecified: Secondary | ICD-10-CM | POA: Diagnosis present

## 2018-07-14 DIAGNOSIS — D696 Thrombocytopenia, unspecified: Secondary | ICD-10-CM | POA: Diagnosis present

## 2018-07-14 DIAGNOSIS — O9842 Viral hepatitis complicating childbirth: Secondary | ICD-10-CM | POA: Diagnosis present

## 2018-07-14 DIAGNOSIS — O98419 Viral hepatitis complicating pregnancy, unspecified trimester: Secondary | ICD-10-CM | POA: Diagnosis present

## 2018-07-14 DIAGNOSIS — O99019 Anemia complicating pregnancy, unspecified trimester: Secondary | ICD-10-CM | POA: Diagnosis present

## 2018-07-14 DIAGNOSIS — O3413 Maternal care for benign tumor of corpus uteri, third trimester: Secondary | ICD-10-CM | POA: Diagnosis present

## 2018-07-14 DIAGNOSIS — D649 Anemia, unspecified: Secondary | ICD-10-CM | POA: Diagnosis present

## 2018-07-14 DIAGNOSIS — O480 Post-term pregnancy: Principal | ICD-10-CM | POA: Diagnosis present

## 2018-07-14 DIAGNOSIS — Z3A41 41 weeks gestation of pregnancy: Secondary | ICD-10-CM

## 2018-07-14 DIAGNOSIS — Z9189 Other specified personal risk factors, not elsewhere classified: Secondary | ICD-10-CM

## 2018-07-14 DIAGNOSIS — O9902 Anemia complicating childbirth: Secondary | ICD-10-CM | POA: Diagnosis present

## 2018-07-14 DIAGNOSIS — Z98891 History of uterine scar from previous surgery: Secondary | ICD-10-CM

## 2018-07-14 DIAGNOSIS — O9912 Other diseases of the blood and blood-forming organs and certain disorders involving the immune mechanism complicating childbirth: Secondary | ICD-10-CM | POA: Diagnosis present

## 2018-07-14 DIAGNOSIS — B181 Chronic viral hepatitis B without delta-agent: Secondary | ICD-10-CM | POA: Diagnosis present

## 2018-07-14 DIAGNOSIS — Z3483 Encounter for supervision of other normal pregnancy, third trimester: Secondary | ICD-10-CM

## 2018-07-14 DIAGNOSIS — O3410 Maternal care for benign tumor of corpus uteri, unspecified trimester: Secondary | ICD-10-CM

## 2018-07-14 DIAGNOSIS — D259 Leiomyoma of uterus, unspecified: Secondary | ICD-10-CM | POA: Diagnosis present

## 2018-07-14 LAB — TYPE AND SCREEN
ABO/RH(D): O POS
Antibody Screen: NEGATIVE

## 2018-07-14 LAB — CBC
HCT: 33.2 % — ABNORMAL LOW (ref 36.0–46.0)
Hemoglobin: 10.9 g/dL — ABNORMAL LOW (ref 12.0–15.0)
MCH: 25.2 pg — ABNORMAL LOW (ref 26.0–34.0)
MCHC: 32.8 g/dL (ref 30.0–36.0)
MCV: 76.7 fL — ABNORMAL LOW (ref 80.0–100.0)
Platelets: 125 10*3/uL — ABNORMAL LOW (ref 150–400)
RBC: 4.33 MIL/uL (ref 3.87–5.11)
RDW: 14.1 % (ref 11.5–15.5)
WBC: 6.2 10*3/uL (ref 4.0–10.5)
nRBC: 0 % (ref 0.0–0.2)

## 2018-07-14 MED ORDER — LACTATED RINGERS IV SOLN: 500.0000 mL | Freq: Once | INTRAVENOUS | Status: DC

## 2018-07-14 MED ORDER — PHENYLEPHRINE 40 MCG/ML (10ML) SYRINGE FOR IV PUSH (FOR BLOOD PRESSURE SUPPORT)
80.0000 ug | PREFILLED_SYRINGE | INTRAVENOUS | Status: DC | PRN
  Filled 2018-07-14: qty 10

## 2018-07-14 MED ORDER — OXYCODONE-ACETAMINOPHEN 5-325 MG PO TABS: 2.0000 | ORAL_TABLET | ORAL | Status: DC | PRN

## 2018-07-14 MED ORDER — LIDOCAINE HCL (PF) 1 % IJ SOLN: 30.0000 mL | INTRAMUSCULAR | Status: DC | PRN

## 2018-07-14 MED ORDER — ONDANSETRON HCL 4 MG/2ML IJ SOLN: 4.0000 mg | Freq: Four times a day (QID) | INTRAMUSCULAR | Status: DC | PRN

## 2018-07-14 MED ORDER — SODIUM CHLORIDE 0.9 % IV SOLN
2.0000 g | Freq: Four times a day (QID) | INTRAVENOUS | Status: DC
  Administered 2018-07-14: 2 g via INTRAVENOUS
  Filled 2018-07-14: qty 2000
  Filled 2018-07-14: qty 2
  Filled 2018-07-14: qty 2000

## 2018-07-14 MED ORDER — FLEET ENEMA 7-19 GM/118ML RE ENEM: 1.0000 | ENEMA | RECTAL | Status: DC | PRN

## 2018-07-14 MED ORDER — SODIUM CHLORIDE 0.9 % IV SOLN
500.0000 mg | INTRAVENOUS | Status: AC
  Administered 2018-07-15: 500 mg via INTRAVENOUS
  Filled 2018-07-14 (×2): qty 500

## 2018-07-14 MED ORDER — ACETAMINOPHEN 325 MG PO TABS: 650.0000 mg | ORAL_TABLET | ORAL | Status: DC | PRN

## 2018-07-14 MED ORDER — ACETAMINOPHEN 500 MG PO TABS
1000.0000 mg | ORAL_TABLET | Freq: Once | ORAL | Status: AC
  Administered 2018-07-14: 1000 mg via ORAL
  Filled 2018-07-14: qty 2

## 2018-07-14 MED ORDER — LIDOCAINE-EPINEPHRINE (PF) 2 %-1:200000 IJ SOLN
INTRAMUSCULAR | Status: DC | PRN
  Administered 2018-07-14: 3 mL via EPIDURAL
  Administered 2018-07-14: 4 mL via EPIDURAL

## 2018-07-14 MED ORDER — PHENYLEPHRINE 40 MCG/ML (10ML) SYRINGE FOR IV PUSH (FOR BLOOD PRESSURE SUPPORT): 80.0000 ug | PREFILLED_SYRINGE | INTRAVENOUS | Status: DC | PRN

## 2018-07-14 MED ORDER — TERBUTALINE SULFATE 1 MG/ML IJ SOLN: 0.2500 mg | Freq: Once | INTRAMUSCULAR | Status: DC | PRN

## 2018-07-14 MED ORDER — LACTATED RINGERS IV SOLN: 500.0000 mL | INTRAVENOUS | Status: DC | PRN

## 2018-07-14 MED ORDER — OXYTOCIN 40 UNITS IN NORMAL SALINE INFUSION - SIMPLE MED: 2.5000 [IU]/h | INTRAVENOUS | Status: DC

## 2018-07-14 MED ORDER — OXYTOCIN 40 UNITS IN NORMAL SALINE INFUSION - SIMPLE MED
1.0000 m[IU]/min | INTRAVENOUS | Status: DC
  Administered 2018-07-14: 1 m[IU]/min via INTRAVENOUS
  Filled 2018-07-14: qty 1000

## 2018-07-14 MED ORDER — EPHEDRINE 5 MG/ML INJ: 10.0000 mg | INTRAVENOUS | Status: DC | PRN

## 2018-07-14 MED ORDER — FENTANYL 2.5 MCG/ML BUPIVACAINE 1/10 % EPIDURAL INFUSION (WH - ANES)
14.0000 mL/h | INTRAMUSCULAR | Status: DC | PRN
  Administered 2018-07-14 (×2): 14 mL/h via EPIDURAL
  Filled 2018-07-14 (×2): qty 100

## 2018-07-14 MED ORDER — OXYTOCIN BOLUS FROM INFUSION: 500.0000 mL | Freq: Once | INTRAVENOUS | Status: DC

## 2018-07-14 MED ORDER — LACTATED RINGERS IV SOLN
INTRAVENOUS | Status: DC
  Administered 2018-07-14 – 2018-07-15 (×6): via INTRAVENOUS

## 2018-07-14 MED ORDER — LACTATED RINGERS IV BOLUS: 1000.0000 mL | Freq: Once | INTRAVENOUS | Status: DC

## 2018-07-14 MED ORDER — DIPHENHYDRAMINE HCL 50 MG/ML IJ SOLN: 12.5000 mg | INTRAMUSCULAR | Status: DC | PRN

## 2018-07-14 MED ORDER — GENTAMICIN SULFATE 40 MG/ML IJ SOLN
5.0000 mg/kg | INTRAVENOUS | Status: DC
  Administered 2018-07-14: 440 mg via INTRAVENOUS
  Filled 2018-07-14: qty 11

## 2018-07-14 MED ORDER — SOD CITRATE-CITRIC ACID 500-334 MG/5ML PO SOLN
30.0000 mL | ORAL | Status: DC | PRN
  Administered 2018-07-15: 30 mL via ORAL
  Filled 2018-07-14: qty 15

## 2018-07-14 MED ORDER — FENTANYL CITRATE (PF) 100 MCG/2ML IJ SOLN
100.0000 ug | INTRAMUSCULAR | Status: DC | PRN
  Administered 2018-07-14 (×2): 100 ug via INTRAVENOUS
  Filled 2018-07-14 (×2): qty 2

## 2018-07-14 NOTE — Anesthesia Procedure Notes (Signed)
Epidural Patient location during procedure: OB Start time: 07/14/2018 11:50 AM End time: 07/14/2018 12:05 PM  Staffing Anesthesiologist: Freddrick March, MD Performed: anesthesiologist   Preanesthetic Checklist Completed: patient identified, pre-op evaluation, timeout performed, IV checked, risks and benefits discussed and monitors and equipment checked  Epidural Patient position: sitting Prep: site prepped and draped and DuraPrep Patient monitoring: continuous pulse ox, blood pressure, heart rate and cardiac monitor Approach: midline Location: L3-L4 Injection technique: LOR air  Needle:  Needle type: Tuohy  Needle gauge: 17 G Needle length: 9 cm Needle insertion depth: 6 cm Catheter type: closed end flexible Catheter size: 19 Gauge Catheter at skin depth: 12 cm Test dose: negative  Assessment Sensory level: T8 Events: blood not aspirated, injection not painful, no injection resistance, negative IV test and no paresthesia  Additional Notes Patient identified. Risks/Benefits/Options discussed with patient including but not limited to bleeding, infection, nerve damage, paralysis, failed block, incomplete pain control, headache, blood pressure changes, nausea, vomiting, reactions to medication both or allergic, itching and postpartum back pain. Confirmed with bedside nurse the patient's most recent platelet count. Confirmed with patient that they are not currently taking any anticoagulation, have any bleeding history or any family history of bleeding disorders. Patient expressed understanding and wished to proceed. All questions were answered. Sterile technique was used throughout the entire procedure. Please see nursing notes for vital signs. Test dose was given through epidural catheter and negative prior to continuing to dose epidural or start infusion. Warning signs of high block given to the patient including shortness of breath, tingling/numbness in hands, complete motor block,  or any concerning symptoms with instructions to call for help. Patient was given instructions on fall risk and not to get out of bed. All questions and concerns addressed with instructions to call with any issues or inadequate analgesia.  Reason for block:procedure for pain

## 2018-07-14 NOTE — Progress Notes (Addendum)
Esparanza Krider MRN: [id number]  Subjective: -Patient s/p epidural placement and reports improving comfort.    Objective: BP 119/77   Pulse 85   Temp 97.8 F (36.6 C) (Oral)   Resp 20   Ht 5\' 3"  (1.6 m)   Wt 88.4 kg   LMP 09/30/2017   SpO2 97%   BMI 34.52 kg/m  No intake/output data recorded. No intake/output data recorded.  Fetal Monitoring: FHT: 120 bpm, Mod Var, -Decels, -Accels +Scalp Stim UC: Q 4-57min, palpates moderate   Vaginal Exam: SVE:   Dilation: 6.5 Effacement (%): 50 Station: -2 Exam by:: J.Equan Cogbill, CNM  Membranes:AROM Internal Monitors: None  Augmentation/Induction: Pitocin:51mUn/min Cytotec: None  Assessment:  IUP at 41wks Cat I FT  Amniotomy  Plan: -Discussed AROM r/b including increased risk of infection, cord prolapse, fetal intolerance, and decreased labor time. No questions or concerns and patient desires to proceed with AROM.  -Will place in high fowlers to promote fetal descent -Continue other mgmt as ordered   Riley Churches, CNM 07/14/2018, 12:26 PM   Addendum  -Nurse call and reports patient with c/o vaginal pressure and forebag noted with exam.  In room to assess  Exam remains the same Forebag ruptured bluntly IUPC inserted without incident   -Discussed IUPC r/b, prior to insertion, including increased risk of infection and ability to adequately monitor quantity and strength of contractions. -Continue mgmt as ordered  Maryann Conners MSN, CNM 2:00 PM

## 2018-07-14 NOTE — Progress Notes (Addendum)
Andrea Serrano MRN: [id number]  Subjective: -Strip reviewed.  Patient continues to report rectal pressure.    Objective: BP 133/74   Pulse 68   Temp 98.2 F (36.8 C) (Oral)   Resp 20   Ht 5\' 3"  (1.6 m)   Wt 88.4 kg   LMP 09/30/2017   SpO2 97%   BMI 34.52 kg/m  No intake/output data recorded. No intake/output data recorded.  Fetal Monitoring: FHT: 120 bpm, Mod Var, +Decels, +Accels UC: Q1-2 with coupling MVUs   270  Vaginal Exam: SVE:   Dilation: 6.5 Effacement (%): 50 Station: -2 Exam by:: J.Elon Eoff, CNM Membranes:AROM Internal Monitors: IUPC  Augmentation/Induction: Pitocin:39mUn/min Cytotec: None  Assessment:  IUP at 41wks Cat II FT  Labor Induction Cervical Edema H/O FTP  Plan: -Position change (Hands and knees) for improvement of fetal tracing. -Position change to promote fetal descent and relief of cervical edema  -Will consider ice to cervix also once back in supine/fowlers position -Continue other mgmt as ordered  Riley Churches, CNM 07/14/2018, 4:18 PM

## 2018-07-14 NOTE — Progress Notes (Addendum)
LABOR PROGRESS NOTE  Andrea Serrano is a 33 y.o. [id number] at [redacted]w[redacted]d  admitted for IOLPD  Subjective: Feeling comfortable with epidural   Objective: BP (!) 141/77   Pulse 93   Temp (!) 101.8 F (38.8 C) (Axillary)   Resp 18   Ht 5\' 3"  (1.6 m)   Wt 88.4 kg   LMP 09/30/2017   SpO2 97%   BMI 34.52 kg/m  or  Vitals:   07/14/18 2001 07/14/18 2031 07/14/18 2101 07/14/18 2131  BP: 98/67 126/73 139/84 (!) 141/77  Pulse: (!) 147 87 90 93  Resp:   18   Temp:    (!) 101.8 F (38.8 C)  TempSrc:    Axillary  SpO2:      Weight:      Height:        Dilation: 6.5 Effacement (%): 50 Cervical Position: Middle Station: -2 Presentation: Vertex Exam by:: Dr. Halford Decamp: baseline rate 125, minimal varibility, + acel, variable decel Toco: adequate MVUs  Labs: Lab Results  Component Value Date   WBC 6.2 07/14/2018   HGB 10.9 (L) 07/14/2018   HCT 33.2 (L) 07/14/2018   MCV 76.7 (L) 07/14/2018   PLT 125 (L) 07/14/2018    Patient Active Problem List   Diagnosis Date Noted  . Post-dates pregnancy 07/14/2018  . Uterine fibroid in pregnancy 06/09/2018  . Encounter for supervision of normal pregnancy in multigravida in third trimester 01/01/2018  . History of cesarean section 01/01/2018  . Thrombocytopenia ([place]) 10/07/2013  . Chronic hepatitis B affecting antepartum care of mother [facility]) 08/04/2013    Assessment / Plan: 33 y.o. [id number] at [redacted]w[redacted]d here for IOLPD  Labor: cervix swollen. 6.5cm since 12pm. Will continue pitocin and repositioning for now  Fetal Wellbeing:  Cat 2 (reassuring) Pain Control:  Comfortable with epidural  Anticipated MOD:  Discussed possible rLTCS for arrest of descent with patient, who is agreeable. Will discuss with Dr. Rip Harbour as well Presumed [place]: temp 101.8 with intermittent fetal tachycardia. Will give ampicillin, gentamycin, tylenol and LR bolus.   Aura Camps, MD  OB Fellow  07/14/2018, 9:41 PM

## 2018-07-14 NOTE — Anesthesia Preprocedure Evaluation (Signed)
Anesthesia Evaluation  Patient identified by MRN, date of birth, ID band Patient awake    Reviewed: Allergy & Precautions, NPO status , Patient's Chart, lab work & pertinent test results  Airway Mallampati: II  TM Distance: >3 FB Neck ROM: Full    Dental no notable dental hx.    Pulmonary neg pulmonary ROS,    Pulmonary exam normal breath sounds clear to auscultation       Cardiovascular negative cardio ROS Normal cardiovascular exam Rhythm:Regular Rate:Normal     Neuro/Psych negative neurological ROS  negative psych ROS   GI/Hepatic negative GI ROS, (+) Hepatitis -, B  Endo/Other  negative endocrine ROS  Renal/GU negative Renal ROS  negative genitourinary   Musculoskeletal negative musculoskeletal ROS (+)   Abdominal   Peds  Hematology  (+) Blood dyscrasia, anemia , thrombocytopenia   Anesthesia Other Findings   Reproductive/Obstetrics negative OB ROS                             Anesthesia Physical Anesthesia Plan  ASA: II  Anesthesia Plan: Epidural   Post-op Pain Management:    Induction:   PONV Risk Score and Plan: Treatment may vary due to age or medical condition  Airway Management Planned: Natural Airway  Additional Equipment:   Intra-op Plan:   Post-operative Plan:   Informed Consent: I have reviewed the patients History and Physical, chart, labs and discussed the procedure including the risks, benefits and alternatives for the proposed anesthesia with the patient or authorized representative who has indicated his/her understanding and acceptance.       Plan Discussed with: Anesthesiologist  Anesthesia Plan Comments: (Patient identified. Risks, benefits, options discussed with patient including but not limited to bleeding, infection, nerve damage, paralysis, failed block, incomplete pain control, headache, blood pressure changes, nausea, vomiting, reactions to  medication, itching, and post partum back pain. Confirmed with bedside nurse the patient's most recent platelet count. Confirmed with the patient that they are not taking any anticoagulation, have any bleeding history or any family history of bleeding disorders. Patient expressed understanding and wishes to proceed. All questions were answered. )        Anesthesia Quick Evaluation

## 2018-07-14 NOTE — H&P (Signed)
Andrea Serrano is a 33 y.o. female, G4P1021 at 65 weeks, presenting for IOL s/t Postdates.  She completed her [facility] at [facility] and was supervised for a high risk pregnancy. Pregnancy and medical history significant for problems as listed below. Patient TOLAC consents signed and she is GBS Negative.  Husband at bedside.    Patient Active Problem List   Diagnosis Date Noted  . Post-dates pregnancy 07/14/2018  . Uterine fibroid in pregnancy 06/09/2018  . Encounter for supervision of normal pregnancy in multigravida in third trimester 01/01/2018  . History of cesarean section 01/01/2018  . Thrombocytopenia ([place]) 10/07/2013  . Chronic hepatitis B affecting antepartum care of mother [facility]) 08/04/2013    History of present pregnancy:  Last evaluation:  Jul 09, 2018 with Tomi Bamberger, PA   Nursing Staff Provider  Office Location  [place] Dating  LMP c/w 11 week scan  Language  English Anatomy US  Normal female but fibroids so is getting growth scans  Flu Vaccine  03/20/18 Genetic Screen  Declined   TDaP vaccine   04/18/18 Hgb A1C or  GTT Third trimester 84 120 113  Rhogam   N/A   LAB RESULTS   Feeding Plan Breast/bottle Blood Type O/Positive/-- (07/24 1458)   Contraception States does not want any birthcontrol Antibody Negative (07/24 1458)  Circumcision NA female Rubella 18.50 (07/24 1458)  Pediatrician  [facility] RPR Non Reactive (07/24 1458)   Support Person Steve(FOB) HBsAg Positive (07/24 1458)   Prenatal Classes LIST GIVEN HIV Non Reactive (07/24 1458)  VBAC Consent 04/18/2018 GBS   Negative    Pap  Normal 01/01/18    Hgb Electro  AA    CF neg    SMA 2    OB History    Gravida  4   Para  1   Term  1   Preterm  0   AB  2   Living  1     SAB  0   TAB  2   Ectopic  0   Multiple  0   Live Births  1           Past Medical History:  Diagnosis Date  . Constipation   . Hepatitis B   . Thrombocytopenia affecting pregnancy, antepartum [facility])    Past Surgical History:   Procedure Laterality Date  . CESAREAN SECTION N/A 12/31/2013   Procedure: Primary Cesarean Section with Delivery Baby Boy @ 0157,  [place] 8/9;  Surgeon: Florian Buff, MD;  Location: [facility];  Service: Obstetrics;  Laterality: N/A;  . TOOTH EXTRACTION     Family History: family history includes Heart disease in her father; Hypertension in her father and mother. Social History:  reports that she has never smoked. She has never used smokeless tobacco. She reports that she does not drink alcohol or use drugs.   Prenatal Transfer Tool  Maternal Diabetes: No Genetic Screening: Declined Maternal Ultrasounds/Referrals: Normal with Fibroids noted Fetal Ultrasounds or other Referrals:  None Maternal Substance Abuse:  No Significant Maternal Medications:  None Significant Maternal Lab Results: Lab values include: Group B Strep negative   Maternal Assessment:  ROS: +Contractions, -LOF, -Vaginal Bleeding, +Fetal Movement  All other systems reviewed and negative.   Results for orders placed or performed during the hospital encounter of 07/14/18 (from the past 24 hour(s))  CBC     Status: Abnormal   Collection Time: 07/14/18  7:29 AM  Result Value Ref Range   WBC 6.2 4.0 -  10.5 K/uL   RBC 4.33 3.87 - 5.11 MIL/uL   Hemoglobin 10.9 (L) 12.0 - 15.0 g/dL   HCT 33.2 (L) 36.0 - 46.0 %   MCV 76.7 (L) 80.0 - 100.0 fL   MCH 25.2 (L) 26.0 - 34.0 pg   MCHC 32.8 30.0 - 36.0 g/dL   RDW 14.1 11.5 - 15.5 %   Platelets 125 (L) 150 - 400 K/uL   nRBC 0.0 0.0 - 0.2 %    No Known Allergies   Dilation: 1.5 Effacement (%): 50 Station: -3 Exam by:: Cecelia Byars, RN Blood pressure 130/85, pulse 78, temperature 97.8 F (36.6 C), temperature source Oral, resp. rate 16, height 5\' 3"  (1.6 m), weight 88.4 kg, last menstrual period 09/30/2017, currently breastfeeding.  Physical Exam  Constitutional: She is oriented to person, place, and time. She appears well-developed and well-nourished.  HENT:  Head:  Normocephalic and atraumatic.  Eyes: Conjunctivae are normal.  Neck: Normal range of motion.  Cardiovascular: Normal rate, regular rhythm and normal heart sounds.  Respiratory: Effort normal and breath sounds normal.  GI: Soft. Bowel sounds are normal.  Gravid--fundal height appears AGA, Soft RT, Ctx palpate moderate, NT   Musculoskeletal: Normal range of motion.        General: Edema (Nonpitting) present.  Neurological: She is alert and oriented to person, place, and time.  Skin: Skin is warm and dry.  Psychiatric: She has a normal mood and affect. Her behavior is normal.    Fetal Assessment: [place]: -Pelvis: Not assessed by Provider -EFW: 7-8 lbs -Presentation: Cephalic by Nurse Exam  FHR: 125 bpm, Min Var, + Early Decels, -Accels (S/P Fentanyl Dosing) UCs:  Q1-6min, palpates moderate to strong    Assessment IUP at 41wks Cat I FT overall TOLAC PD IOL Thrombocytopenia GBS Negative  Plan: Admit to [facility]  Routine Labor and Delivery Orders per Protocol Routine Induction/Augmentation Orders per Protocol In room to complete assessment and discuss POC: -Pitocin in progress at 68mUn/min -Patient s/p dose of Fentanyl-Reports improvement in pain -Discussed previous c/s and patient states she progressed to 8cm and then didn't go further -No current questions or concerns -Will consider AROM at next VE, if appropriate -Continue other mgmt as ordered  Loann Quill, MSN 07/14/2018, 9:35 AM

## 2018-07-14 NOTE — Progress Notes (Signed)
OB Attending  As per OB fellows note, no cervical changes for the last 8 + hours despite adequate labor as by IUPC. Pt with H/O C section in 2015.   C section recommended to pt d/t arrest of descent and dilatation  The risks of cesarean section discussed with the patient included but were not limited to: bleeding which may require transfusion or reoperation; infection which may require antibiotics; injury to bowel, bladder, ureters or other surrounding organs; injury to the fetus; need for additional procedures including hysterectomy in the event of a life-threatening hemorrhage; placental abnormalities wth subsequent pregnancies, incisional problems, thromboembolic phenomenon and other postoperative/anesthesia complications. The patient concurred with the proposed plan, giving informed written consent for the procedure.   Anesthesia and OR aware. Preoperative prophylactic antibiotics and SCDs ordered on call to the OR.  To OR when ready.   Pt verbalized understanding and agrees with POC.   Apolinar Bero L. Rip Harbour, MD

## 2018-07-14 NOTE — Anesthesia Pain Management Evaluation Note (Signed)
  CRNA Pain Management Visit Note  Patient: Andrea Serrano, 33 y.o., female  "Hello I am a member of the anesthesia team at [facility]. We have an anesthesia team available at all times to provide care throughout the hospital, including epidural management and anesthesia for C-section. I don't know your plan for the delivery whether it a natural birth, water birth, IV sedation, nitrous supplementation, doula or epidural, but we want to meet your pain goals."   1.Was your pain managed to your expectations on prior hospitalizations?   Yes   2.What is your expectation for pain management during this hospitalization?     Epidural  3.How can we help you reach that goal? epidural  Record the patient's initial score and the patient's pain goal.   Pain: 6  Pain Goal: 6 [facility] wants you to be able to say your pain was always managed very well.  Owens Hara 07/14/2018

## 2018-07-15 ENCOUNTER — Encounter (HOSPITAL_COMMUNITY): Payer: Self-pay | Admitting: Obstetrics and Gynecology

## 2018-07-15 ENCOUNTER — Encounter (HOSPITAL_COMMUNITY)
Admission: RE | Disposition: A | Discharge status: Home / Self Care | Payer: Self-pay | Source: Home / Self Care | Attending: Obstetrics and Gynecology

## 2018-07-15 DIAGNOSIS — O99019 Anemia complicating pregnancy, unspecified trimester: Secondary | ICD-10-CM | POA: Diagnosis present

## 2018-07-15 DIAGNOSIS — Z3A41 41 weeks gestation of pregnancy: Secondary | ICD-10-CM

## 2018-07-15 DIAGNOSIS — O480 Post-term pregnancy: Secondary | ICD-10-CM

## 2018-07-15 DIAGNOSIS — O3413 Maternal care for benign tumor of corpus uteri, third trimester: Secondary | ICD-10-CM

## 2018-07-15 LAB — CBC
HCT: 28.4 % — ABNORMAL LOW (ref 36.0–46.0)
Hemoglobin: 9.4 g/dL — ABNORMAL LOW (ref 12.0–15.0)
MCH: 25 pg — ABNORMAL LOW (ref 26.0–34.0)
MCHC: 33.1 g/dL (ref 30.0–36.0)
MCV: 75.5 fL — ABNORMAL LOW (ref 80.0–100.0)
Platelets: 105 10*3/uL — ABNORMAL LOW (ref 150–400)
RBC: 3.76 MIL/uL — ABNORMAL LOW (ref 3.87–5.11)
RDW: 14.1 % (ref 11.5–15.5)
WBC: 11.1 10*3/uL — ABNORMAL HIGH (ref 4.0–10.5)
nRBC: 0 % (ref 0.0–0.2)

## 2018-07-15 LAB — RPR: RPR: NONREACTIVE

## 2018-07-15 SURGERY — Surgical Case
Anesthesia: Epidural | Wound class: Clean Contaminated

## 2018-07-15 MED ORDER — SENNOSIDES-DOCUSATE SODIUM 8.6-50 MG PO TABS
2.0000 | ORAL_TABLET | ORAL | Status: DC
  Administered 2018-07-15 – 2018-07-17 (×2): 2 via ORAL
  Filled 2018-07-15 (×2): qty 2

## 2018-07-15 MED ORDER — DEXAMETHASONE SODIUM PHOSPHATE 10 MG/ML IJ SOLN
INTRAMUSCULAR | Status: DC | PRN
  Administered 2018-07-15: 10 mg via INTRAVENOUS

## 2018-07-15 MED ORDER — LACTATED RINGERS IV SOLN
INTRAVENOUS | Status: DC | PRN
  Administered 2018-07-15: 02:00:00 via INTRAVENOUS

## 2018-07-15 MED ORDER — FENTANYL CITRATE (PF) 100 MCG/2ML IJ SOLN
INTRAMUSCULAR | Status: AC
  Filled 2018-07-15: qty 2

## 2018-07-15 MED ORDER — KETOROLAC TROMETHAMINE 30 MG/ML IJ SOLN: 30.0000 mg | Freq: Four times a day (QID) | INTRAMUSCULAR | Status: AC | PRN

## 2018-07-15 MED ORDER — IBUPROFEN 800 MG PO TABS
800.0000 mg | ORAL_TABLET | Freq: Three times a day (TID) | ORAL | Status: DC
  Administered 2018-07-16 (×2): 800 mg via ORAL
  Filled 2018-07-15 (×3): qty 1

## 2018-07-15 MED ORDER — ONDANSETRON HCL 4 MG/2ML IJ SOLN
INTRAMUSCULAR | Status: AC
  Filled 2018-07-15: qty 2

## 2018-07-15 MED ORDER — MORPHINE SULFATE (PF) 0.5 MG/ML IJ SOLN
INTRAMUSCULAR | Status: DC | PRN
  Administered 2018-07-15: 3 mg via EPIDURAL

## 2018-07-15 MED ORDER — MORPHINE SULFATE (PF) 0.5 MG/ML IJ SOLN
INTRAMUSCULAR | Status: AC
  Filled 2018-07-15: qty 10

## 2018-07-15 MED ORDER — ENOXAPARIN SODIUM 40 MG/0.4ML ~~LOC~~ SOLN
40.0000 mg | SUBCUTANEOUS | Status: DC
  Administered 2018-07-16 – 2018-07-17 (×2): 40 mg via SUBCUTANEOUS
  Filled 2018-07-15 (×2): qty 0.4

## 2018-07-15 MED ORDER — NALOXONE HCL 0.4 MG/ML IJ SOLN: 0.4000 mg | INTRAMUSCULAR | Status: DC | PRN

## 2018-07-15 MED ORDER — KETOROLAC TROMETHAMINE 30 MG/ML IJ SOLN
INTRAMUSCULAR | Status: DC | PRN
  Administered 2018-07-15: 30 mg via INTRAVENOUS

## 2018-07-15 MED ORDER — DIPHENHYDRAMINE HCL 25 MG PO CAPS
25.0000 mg | ORAL_CAPSULE | ORAL | Status: DC | PRN
  Administered 2018-07-15 (×2): 25 mg via ORAL
  Filled 2018-07-15: qty 1

## 2018-07-15 MED ORDER — MEPERIDINE HCL 25 MG/ML IJ SOLN: 6.2500 mg | INTRAMUSCULAR | Status: DC | PRN

## 2018-07-15 MED ORDER — DIPHENHYDRAMINE HCL 50 MG/ML IJ SOLN
INTRAMUSCULAR | Status: AC
  Administered 2018-07-15: 12.5 mg via INTRAVENOUS
  Filled 2018-07-15: qty 1

## 2018-07-15 MED ORDER — SODIUM BICARBONATE 8.4 % IV SOLN
INTRAVENOUS | Status: DC | PRN
  Administered 2018-07-15: 10 mL via EPIDURAL

## 2018-07-15 MED ORDER — DIPHENHYDRAMINE HCL 25 MG PO CAPS
25.0000 mg | ORAL_CAPSULE | Freq: Four times a day (QID) | ORAL | Status: DC | PRN
  Filled 2018-07-15 (×2): qty 1

## 2018-07-15 MED ORDER — OXYTOCIN 40 UNITS IN NORMAL SALINE INFUSION - SIMPLE MED: 2.5000 [IU]/h | INTRAVENOUS | Status: AC

## 2018-07-15 MED ORDER — WITCH HAZEL-GLYCERIN EX PADS: 1.0000 "application " | MEDICATED_PAD | CUTANEOUS | Status: DC | PRN

## 2018-07-15 MED ORDER — SIMETHICONE 80 MG PO CHEW
80.0000 mg | CHEWABLE_TABLET | ORAL | Status: DC
  Administered 2018-07-15 – 2018-07-17 (×2): 80 mg via ORAL
  Filled 2018-07-15 (×2): qty 1

## 2018-07-15 MED ORDER — KETOROLAC TROMETHAMINE 30 MG/ML IJ SOLN
30.0000 mg | Freq: Four times a day (QID) | INTRAMUSCULAR | Status: AC
  Administered 2018-07-15 (×3): 30 mg via INTRAVENOUS
  Filled 2018-07-15 (×3): qty 1

## 2018-07-15 MED ORDER — DIPHENHYDRAMINE HCL 50 MG/ML IJ SOLN
12.5000 mg | INTRAMUSCULAR | Status: DC | PRN
  Administered 2018-07-15: 12.5 mg via INTRAVENOUS

## 2018-07-15 MED ORDER — ONDANSETRON HCL 4 MG/2ML IJ SOLN: 4.0000 mg | Freq: Three times a day (TID) | INTRAMUSCULAR | Status: DC | PRN

## 2018-07-15 MED ORDER — ONDANSETRON HCL 4 MG/2ML IJ SOLN
INTRAMUSCULAR | Status: DC | PRN
  Administered 2018-07-15: 4 mg via INTRAVENOUS

## 2018-07-15 MED ORDER — LACTATED RINGERS IV SOLN
INTRAVENOUS | Status: DC
  Administered 2018-07-15: 08:00:00 via INTRAVENOUS

## 2018-07-15 MED ORDER — SIMETHICONE 80 MG PO CHEW: 80.0000 mg | CHEWABLE_TABLET | ORAL | Status: DC | PRN

## 2018-07-15 MED ORDER — DIBUCAINE 1 % RE OINT: 1.0000 "application " | TOPICAL_OINTMENT | RECTAL | Status: DC | PRN

## 2018-07-15 MED ORDER — SIMETHICONE 80 MG PO CHEW
80.0000 mg | CHEWABLE_TABLET | Freq: Three times a day (TID) | ORAL | Status: DC
  Administered 2018-07-15 – 2018-07-17 (×6): 80 mg via ORAL
  Filled 2018-07-15 (×6): qty 1

## 2018-07-15 MED ORDER — NALOXONE HCL 4 MG/10ML IJ SOLN
1.0000 ug/kg/h | INTRAVENOUS | Status: DC | PRN
  Filled 2018-07-15: qty 5

## 2018-07-15 MED ORDER — SCOPOLAMINE 1 MG/3DAYS TD PT72: 1.0000 | MEDICATED_PATCH | Freq: Once | TRANSDERMAL | Status: DC

## 2018-07-15 MED ORDER — DEXAMETHASONE SODIUM PHOSPHATE 10 MG/ML IJ SOLN
INTRAMUSCULAR | Status: AC
  Filled 2018-07-15: qty 1

## 2018-07-15 MED ORDER — OXYTOCIN 10 UNIT/ML IJ SOLN
INTRAMUSCULAR | Status: AC
  Filled 2018-07-15: qty 4

## 2018-07-15 MED ORDER — ACETAMINOPHEN 500 MG PO TABS
1000.0000 mg | ORAL_TABLET | Freq: Four times a day (QID) | ORAL | Status: AC
  Administered 2018-07-15 (×4): 1000 mg via ORAL
  Filled 2018-07-15 (×4): qty 2

## 2018-07-15 MED ORDER — NALBUPHINE HCL 10 MG/ML IJ SOLN
5.0000 mg | INTRAMUSCULAR | Status: DC | PRN
  Administered 2018-07-15: 5 mg via INTRAVENOUS
  Filled 2018-07-15: qty 1

## 2018-07-15 MED ORDER — OXYTOCIN 10 UNIT/ML IJ SOLN
INTRAVENOUS | Status: DC | PRN
  Administered 2018-07-15: 40 [IU] via INTRAVENOUS

## 2018-07-15 MED ORDER — MENTHOL 3 MG MT LOZG: 1.0000 | LOZENGE | OROMUCOSAL | Status: DC | PRN

## 2018-07-15 MED ORDER — KETOROLAC TROMETHAMINE 30 MG/ML IJ SOLN
INTRAMUSCULAR | Status: AC
  Filled 2018-07-15: qty 1

## 2018-07-15 MED ORDER — NALBUPHINE HCL 10 MG/ML IJ SOLN: 5.0000 mg | Freq: Once | INTRAMUSCULAR | Status: DC | PRN

## 2018-07-15 MED ORDER — FENTANYL CITRATE (PF) 100 MCG/2ML IJ SOLN
50.0000 ug | INTRAMUSCULAR | Status: DC | PRN
  Administered 2018-07-15: 50 ug via INTRAVENOUS

## 2018-07-15 MED ORDER — TETANUS-DIPHTH-ACELL PERTUSSIS 5-2.5-18.5 LF-MCG/0.5 IM SUSP: 0.5000 mL | Freq: Once | INTRAMUSCULAR | Status: DC

## 2018-07-15 MED ORDER — NALBUPHINE HCL 10 MG/ML IJ SOLN: 5.0000 mg | INTRAMUSCULAR | Status: DC | PRN

## 2018-07-15 MED ORDER — COCONUT OIL OIL: 1.0000 "application " | TOPICAL_OIL | Status: DC | PRN

## 2018-07-15 MED ORDER — PRENATAL MULTIVITAMIN CH
1.0000 | ORAL_TABLET | Freq: Every day | ORAL | Status: DC
  Administered 2018-07-15 – 2018-07-17 (×3): 1 via ORAL
  Filled 2018-07-15 (×3): qty 1

## 2018-07-15 MED ORDER — OXYCODONE-ACETAMINOPHEN 5-325 MG PO TABS
1.0000 | ORAL_TABLET | ORAL | Status: DC | PRN
  Administered 2018-07-17 (×2): 1 via ORAL
  Filled 2018-07-15 (×2): qty 1

## 2018-07-15 MED ORDER — ZOLPIDEM TARTRATE 5 MG PO TABS: 5.0000 mg | ORAL_TABLET | Freq: Every evening | ORAL | Status: DC | PRN

## 2018-07-15 MED ORDER — SODIUM CHLORIDE 0.9% FLUSH: 3.0000 mL | INTRAVENOUS | Status: DC | PRN

## 2018-07-15 SURGICAL SUPPLY — 41 items
BENZOIN TINCTURE PRP APPL 2/3 (GAUZE/BANDAGES/DRESSINGS) ×3 IMPLANT
CHLORAPREP W/TINT 26ML (MISCELLANEOUS) ×3 IMPLANT
CLAMP CORD UMBIL (MISCELLANEOUS) IMPLANT
CLOSURE WOUND 1/2 X4 (GAUZE/BANDAGES/DRESSINGS) ×1
CLOSURE WOUND 1/4 X3 (GAUZE/BANDAGES/DRESSINGS) ×1
CLOTH BEACON ORANGE TIMEOUT ST (SAFETY) ×3 IMPLANT
DRAPE C SECTION CLR SCREEN (DRAPES) IMPLANT
DRSG OPSITE POSTOP 4X10 (GAUZE/BANDAGES/DRESSINGS) ×3 IMPLANT
ELECT REM PT RETURN 9FT ADLT (ELECTROSURGICAL) ×3
ELECTRODE REM PT RTRN 9FT ADLT (ELECTROSURGICAL) ×1 IMPLANT
EXTRACTOR VACUUM M CUP 4 TUBE (SUCTIONS) IMPLANT
EXTRACTOR VACUUM M CUP 4' TUBE (SUCTIONS)
GLOVE BIO SURGEON STRL SZ7.5 (GLOVE) ×3 IMPLANT
GLOVE BIOGEL PI IND STRL 7.0 (GLOVE) ×1 IMPLANT
GLOVE BIOGEL PI INDICATOR 7.0 (GLOVE) ×2
GOWN STRL REUS W/TWL 2XL LVL3 (GOWN DISPOSABLE) ×3 IMPLANT
GOWN STRL REUS W/TWL LRG LVL3 (GOWN DISPOSABLE) ×6 IMPLANT
KIT ABG SYR 3ML LUER SLIP (SYRINGE) IMPLANT
NEEDLE HYPO 22GX1.5 SAFETY (NEEDLE) ×3 IMPLANT
NEEDLE HYPO 25X5/8 SAFETYGLIDE (NEEDLE) IMPLANT
NS IRRIG 1000ML POUR BTL (IV SOLUTION) ×3 IMPLANT
PACK C SECTION WH (CUSTOM PROCEDURE TRAY) ×3 IMPLANT
PAD OB MATERNITY 4.3X12.25 (PERSONAL CARE ITEMS) ×3 IMPLANT
PENCIL SMOKE EVAC W/HOLSTER (ELECTROSURGICAL) ×3 IMPLANT
RTRCTR C-SECT PINK 25CM LRG (MISCELLANEOUS) ×3 IMPLANT
STRIP CLOSURE SKIN 1/2X4 (GAUZE/BANDAGES/DRESSINGS) ×2 IMPLANT
STRIP CLOSURE SKIN 1/4X3 (GAUZE/BANDAGES/DRESSINGS) ×2 IMPLANT
SUT CHROMIC 1 CTX 36 (SUTURE) ×6 IMPLANT
SUT VIC AB 1 CT1 36 (SUTURE) ×9 IMPLANT
SUT VIC AB 2-0 CT1 (SUTURE) ×3 IMPLANT
SUT VIC AB 2-0 CT1 27 (SUTURE) ×2
SUT VIC AB 2-0 CT1 TAPERPNT 27 (SUTURE) ×1 IMPLANT
SUT VIC AB 3-0 CT1 27 (SUTURE) ×4
SUT VIC AB 3-0 CT1 TAPERPNT 27 (SUTURE) ×2 IMPLANT
SUT VIC AB 3-0 SH 27 (SUTURE)
SUT VIC AB 3-0 SH 27X BRD (SUTURE) IMPLANT
SUT VIC AB 4-0 KS 27 (SUTURE) ×3 IMPLANT
SYR BULB IRRIGATION 50ML (SYRINGE) IMPLANT
TOWEL OR 17X24 6PK STRL BLUE (TOWEL DISPOSABLE) ×3 IMPLANT
TRAY FOLEY W/BAG SLVR 14FR LF (SET/KITS/TRAYS/PACK) ×3 IMPLANT
WATER STERILE IRR 1000ML POUR (IV SOLUTION) ×3 IMPLANT

## 2018-07-15 NOTE — Discharge Summary (Signed)
Postpartum Discharge Summary     Patient Name: Andrea Serrano DOB: 1985/07/10 MRN: [id number]  Date of admission: 07/14/2018 Delivering Provider: Aura Camps   Date of discharge: 07/17/2018  Admitting diagnosis: INDUCTION Intrauterine pregnancy: [redacted]w[redacted]d     Secondary diagnosis:  Active Problems:   Chronic hepatitis B affecting antepartum care of mother ([place])   Thrombocytopenia ([place])   History of cesarean section   Uterine fibroid in pregnancy   Post-dates pregnancy   Anemia affecting fourth pregnancy  Additional problems: None     Discharge diagnosis: Term Pregnancy Delivered, Anemia and thrombocytopenia                                                                                                Post partum procedures:IV iron  Augmentation: AROM and Pitocin  Complications: Intrauterine Inflammation or infection ([place])  Hospital course:  Induction of Labor With Cesarean Section  33 y.o. yo [id number] at [redacted]w[redacted]d was admitted to the hospital 07/14/2018 for induction of labor. Patient had a labor course significant for arrest of dilation at 6.5cm. The patient went for cesarean section due to Arrest of Dilation, and delivered a Viable infant,07/15/2018  Membrane Rupture Time/Date: 12:17 PM ,07/14/2018   Details of operation can be found in separate operative Note.  Patient had an uncomplicated postpartum course. She is ambulating, tolerating a regular diet, passing flatus, and urinating well.  Patient is discharged home in stable condition on 07/17/18.       Anemia was treated with IV feraheme and discharged with PO ferrous sulfate.  Return precautions discussed.  Magnesium Sulfate recieved: No BMZ received: No  Physical exam  Vitals:   07/16/18 0420 07/16/18 1424 07/16/18 2238 07/17/18 0546  BP: 116/66 113/67 133/85 137/78  Pulse: 69 73 86 64  Resp: 16 20 16 18   Temp: 98 F (36.7 C) 97.6 F (36.4 C) 98 F (36.7 C) 99.6 F (37.6 C)  TempSrc: Oral  Oral Oral  SpO2: 100%  100%    Weight:      Height:       General: alert, cooperative and no distress Lochia: appropriate Uterine Fundus: firm Incision: Healing well with no significant drainage, No significant erythema, Dressing is clean, dry, and intact DVT Evaluation: No evidence of DVT seen on physical exam. Labs: Lab Results  Component Value Date   WBC 11.3 (H) 07/16/2018   HGB 7.3 (L) 07/16/2018   HCT 21.7 (L) 07/16/2018   MCV 76.1 (L) 07/16/2018   PLT 101 (L) 07/16/2018   CMP Latest Ref Rng & Units 04/18/2018  Glucose 65 - 99 mg/dL 135(H)  BUN 6 - 20 mg/dL 8  Creatinine 0.57 - 1.00 mg/dL 0.52(L)  Sodium 134 - 144 mmol/L 137  Potassium 3.5 - 5.2 mmol/L 4.0  Chloride 96 - 106 mmol/L 104  CO2 20 - 29 mmol/L 20  Calcium 8.7 - 10.2 mg/dL 8.9  Total Protein 6.0 - 8.5 g/dL 6.4  Total Bilirubin 0.0 - 1.2 mg/dL 0.3  Alkaline Phos 39 - 117 IU/L 69  AST 0 - 40 IU/L 19  ALT 0 - 32 IU/L 23  Discharge instruction: per After Visit Summary and "Baby and Me Booklet".  After visit meds:  Allergies as of 07/17/2018   No Known Allergies     Medication List    TAKE these medications   ferrous sulfate 325 (65 FE) MG tablet Take 1 tablet (325 mg total) by mouth daily.   ibuprofen 800 MG tablet Commonly known as:  ADVIL,MOTRIN Take 1 tablet (800 mg total) by mouth every 8 (eight) hours.   oxyCODONE-acetaminophen 5-325 MG tablet Commonly known as:  PERCOCET/ROXICET Take 1-2 tablets by mouth every 6 (six) hours as needed for moderate pain.   Prenatal Vitamins 0.8 MG tablet Take 1 tablet by mouth daily.       Diet: routine diet  Activity: Advance as tolerated. Pelvic rest for 6 weeks.   Outpatient follow up:6 weeks Follow up Appt: Future Appointments  Date Time Provider [place]  07/29/2018  9:00 AM [place]  08/12/2018  9:35 AM Aura Camps, MD [facility]   Follow up Visit: Please schedule this patient for Postpartum visit in: 6 weeks with the following  provider: Any provider For C/S patients schedule nurse incision check in weeks 2 weeks: yes Low risk pregnancy complicated by: chronic hepatitis B, thrombocytopenia Delivery mode:  CS Anticipated Birth Control:  other/unsure PP Procedures needed: Incision check  Schedule Integrated BH visit: no    Newborn Data: Live born female  Birth Weight: 6 lb 13.2 oz (3095 g) APGAR: 77, 9  Newborn Delivery   Birth date/time:  07/15/2018 01:34:00 Delivery type:  C-Section, Low Transverse Trial of labor:  Yes C-section categorization:  Repeat     Baby Feeding: Breast Disposition:home with mother   07/17/2018 Aura Camps, MD

## 2018-07-15 NOTE — Anesthesia Postprocedure Evaluation (Signed)
Anesthesia Post Note  Patient: Andrea Serrano  Procedure(s) Performed: CESAREAN SECTION (N/A )     Patient location during evaluation: Mother Baby Anesthesia Type: Epidural Level of consciousness: awake and alert and oriented Pain management: satisfactory to patient Vital Signs Assessment: post-procedure vital signs reviewed and stable Respiratory status: respiratory function stable and spontaneous breathing Cardiovascular status: blood pressure returned to baseline Postop Assessment: no headache, no backache, spinal receding, patient able to bend at knees and adequate PO intake Anesthetic complications: no    Last Vitals:  Vitals:   07/15/18 0613 07/15/18 0700  BP: 127/67 132/78  Pulse: 81 81  Resp: 16 18  Temp: 36.9 C 36.8 C  SpO2: 98% 98%    Last Pain:  Vitals:   07/15/18 0800  TempSrc:   PainSc: 0-No pain   Pain Goal:                Epidural/Spinal Function Cutaneous sensation: Normal sensation (07/15/18 0800)  Lyncoln Maskell

## 2018-07-15 NOTE — Lactation Note (Addendum)
This note was copied from a baby's chart. Lactation Consultation Note  Patient Name: Girl Catlyn Shipton OEVOJ'J Date: 07/15/2018 Reason for consult: Initial assessment;Difficult latch;Term  Visited with P2 Mom of term baby at 67 hrs old.  Called by RN to state that baby is unable to latch onto breast due to large diameter nipples.  Baby isn't able to open wide enough to sustain a latch.    Mom choosing to give formula by bottle, using the paced method.  RN set up DEBP and Mom has pumped once already, unable to express more than drops yet.  Reassured Mom that this was normal, and to regularly pump every 2-3 hrs and at least once at night to support her milk supply.  Her first baby was larger at birth, and he was able to latch onto breast.  Reassured Mom that baby would grow and be able to latch most likely and her job was to double pump to support a full milk supply.   Baby sleeping in the crib, quiet and alert.  Offered to help with positioning and latching, but Mom declined saying baby had just had formula.  Encouraged Mom to keep baby STS as much as possible, when Mom isn't sleeping. Mom knows to watch for feeding cues. Mom interested in a nap currently.   Lactation brochure left in room.  Mom aware of IP and OP lactation support available to her.  Encouraged Mom to call for assistance prn.  Faxed [facility] referral sent   Interventions Interventions: Breast feeding basics reviewed;Skin to skin;Breast massage;Hand express;DEBP  Lactation Tools Discussed/Used Tools: Pump;Bottle Breast pump type: Double-Electric Breast Pump Pump Review: Setup, frequency, and cleaning;Milk Storage Initiated by:: RN  Date initiated:: 07/15/18   Consult Status Consult Status: Follow-up Date: 07/16/18 Follow-up type: In-patient    Broadus John 07/15/2018, 6:45 PM

## 2018-07-15 NOTE — Transfer of Care (Signed)
Immediate Anesthesia Transfer of Care Note  Patient: Andrea Serrano  Procedure(s) Performed: CESAREAN SECTION (N/A )  Patient Location: PACU  Anesthesia Type:Epidural  Level of Consciousness: awake, alert  and oriented  Airway & Oxygen Therapy: Patient Spontanous Breathing  Post-op Assessment: Report given to RN and Post -op Vital signs reviewed and stable  Post vital signs: Reviewed and stable  Last Vitals:  Vitals Value Taken Time  BP    Temp    Pulse 85 07/15/2018  2:29 AM  Resp    SpO2 98 % 07/15/2018  2:29 AM  Vitals shown include unvalidated device data.  Last Pain:  Vitals:   07/14/18 2331  TempSrc: Oral  PainSc:          Complications: No apparent anesthesia complications

## 2018-07-15 NOTE — Progress Notes (Signed)
Unable to ambulate patient at this time. Mother holding infant skin to skin for low temps

## 2018-07-15 NOTE — Op Note (Signed)
Cesarean Section Procedure Note  07/15/2018  8:12 AM  PATIENT:  Andrea Serrano  33 y.o. female  PRE-OPERATIVE DIAGNOSIS:  failure to progress - arrest of descent/dilitation, intrapartum fever  POST-OPERATIVE DIAGNOSIS:  failure to progress - arrest of descent/dilitation, intrapartum fever, 2x3cm fibroid on anterior uterus  PROCEDURE:  Procedure(s): CESAREAN SECTION (N/A)  SURGEON:  Surgeon(s) and Role:    * Chancy Milroy, MD - Primary    * Aura Camps, MD - Fellow  ASSISTANTS:  Aura Camps, MD - Fellow  ANESTHESIA:   epidural  EBL:  Total I/O In: - 2369ml Out: 350 [Urine:350]  BLOOD ADMINISTERED:none  DRAINS: none   LOCAL MEDICATIONS USED:  NONE  SPECIMEN:  Source of Specimen:  placenta  DISPOSITION OF SPECIMEN:  PATHOLOGY   Procedure Details   The patient was seen in the delivery room. The risks, benefits, complications, treatment options, and expected outcomes were discussed with the patient.  The patient concurred with the proposed plan, giving informed consent.  The site of surgery properly noted/marked. The patient was taken to Operating Room # 9, identified as [facility] and the procedure verified as C-Section Delivery. A Time Out was held and the above information confirmed.  After induction of anesthesia, the patient was draped and prepped in the usual sterile manner. A Pfannenstiel incision was made and carried down through the subcutaneous tissue to the fascia. Fascial incision was made and extended transversely. The fascia was separated from the underlying rectus tissue superiorly and inferiorly. The peritoneum was identified and entered. Peritoneal incision was extended longitudinally. A low transverse uterine incision was made. Delivered from cephalic presentation was a 3095 gram Female with Apgar scores of 9 at one minute and 9 at five minutes. After the umbilical cord was clamped and cut cord blood was obtained for evaluation. The placenta was  removed intact and appeared normal. The uterine outline showed a 2x3cm fibroid on the anterior surface. The tubes and ovaries appeared normal. The uterine incision was closed with running locked sutures of Chromic. Hemostasis was observed. Lavage was carried out until clear. The peritoneum was then reapproximated with 2.0-vicryl.  The fascia was then reapproximated with running sutures of 0- Vicryl. The skin was reapproximated with 4.0- Vicryl.  Instrument, sponge, and needle counts were correct prior the abdominal closure and at the conclusion of the case.   Complications:  None; patient tolerated the procedure well.  COUNTS:  YES  PLAN OF CARE: Admit to inpatient   PATIENT DISPOSITION:  PACU - hemodynamically stable.   Delay start of Pharmacological VTE agent (>24hrs) due to surgical blood loss or risk of bleeding: no             Disposition: PACU - hemodynamically stable.         Condition: stable   Aura Camps, MD 07/15/2018 8:12 AM

## 2018-07-15 NOTE — Addendum Note (Signed)
Addendum  created 07/15/18 0825 by Flossie Dibble, CRNA   Clinical Note Signed

## 2018-07-15 NOTE — Anesthesia Postprocedure Evaluation (Signed)
Anesthesia Post Note  Patient: Andrea Serrano  Procedure(s) Performed: CESAREAN SECTION (N/A )     Patient location during evaluation: Mother Baby Anesthesia Type: Epidural Level of consciousness: awake and alert Pain management: pain level controlled Vital Signs Assessment: post-procedure vital signs reviewed and stable Respiratory status: spontaneous breathing, nonlabored ventilation and respiratory function stable Cardiovascular status: stable Postop Assessment: no headache, no backache and epidural receding Anesthetic complications: no    Last Vitals:  Vitals:   07/15/18 0511 07/15/18 0613  BP: 125/75 127/67  Pulse: 74 81  Resp: 16 16  Temp: 36.8 C 36.9 C  SpO2: 97% 98%    Last Pain:  Vitals:   07/15/18 0613  TempSrc: Oral  PainSc: 0-No pain   Pain Goal:                Epidural/Spinal Function Cutaneous sensation: Normal sensation (07/15/18 7215), Patient able to flex knees: Yes (07/15/18 8727), Patient able to lift hips off bed: Yes (07/15/18 6184), Back pain beyond tenderness at insertion site: No (07/15/18 8592), Progressively worsening motor and/or sensory loss: No (07/15/18 7639), Bowel and/or bladder incontinence post epidural: No (07/15/18 4320)  Xena Propst

## 2018-07-16 LAB — CBC
HCT: 21.7 % — ABNORMAL LOW (ref 36.0–46.0)
Hemoglobin: 7.3 g/dL — ABNORMAL LOW (ref 12.0–15.0)
MCH: 25.6 pg — ABNORMAL LOW (ref 26.0–34.0)
MCHC: 33.6 g/dL (ref 30.0–36.0)
MCV: 76.1 fL — AB (ref 80.0–100.0)
PLATELETS: 101 10*3/uL — AB (ref 150–400)
RBC: 2.85 MIL/uL — ABNORMAL LOW (ref 3.87–5.11)
RDW: 14.4 % (ref 11.5–15.5)
WBC: 11.3 10*3/uL — ABNORMAL HIGH (ref 4.0–10.5)
nRBC: 0 % (ref 0.0–0.2)

## 2018-07-16 MED ORDER — ACETAMINOPHEN 500 MG PO TABS
1000.0000 mg | ORAL_TABLET | Freq: Four times a day (QID) | ORAL | Status: DC | PRN
  Administered 2018-07-17: 1000 mg via ORAL
  Filled 2018-07-16: qty 2

## 2018-07-16 NOTE — Progress Notes (Signed)
Post Partum Day 1 Subjective: no complaints, tolerating PO and + flatus  Foley removed this morning Has not been up ambulating yet   Objective: Blood pressure 116/66, pulse 69, temperature 98 F (36.7 C), temperature source Oral, resp. rate 16, height 5\' 3"  (1.6 m), weight 88.4 kg, last menstrual period 09/30/2017, SpO2 100 %, currently breastfeeding.  Physical Exam:  General: alert, cooperative, appears stated age and no distress Lochia: appropriate Uterine Fundus: firm Incision: healing well, no significant drainage, no dehiscence, no significant erythema DVT Evaluation: No evidence of DVT seen on physical exam.  Recent Labs    07/15/18 0322 07/16/18 0719  HGB 9.4* 7.3*  HCT 28.4* 21.7*    Assessment/Plan: Plan for discharge tomorrow  Advised patient to ambulate with caution since new onset anemia   LOS: 2 days   Aura Camps 07/16/2018, 9:39 AM

## 2018-07-17 ENCOUNTER — Encounter (HOSPITAL_COMMUNITY): Payer: Self-pay

## 2018-07-17 MED ORDER — SODIUM CHLORIDE 0.9 % IV SOLN
510.0000 mg | Freq: Once | INTRAVENOUS | Status: AC
  Administered 2018-07-17: 510 mg via INTRAVENOUS
  Filled 2018-07-17: qty 17

## 2018-07-17 MED ORDER — IBUPROFEN 800 MG PO TABS: 800.0000 mg | ORAL_TABLET | Freq: Three times a day (TID) | ORAL | 0 refills | Status: DC

## 2018-07-17 MED ORDER — OXYCODONE-ACETAMINOPHEN 5-325 MG PO TABS: 1.0000 | ORAL_TABLET | Freq: Four times a day (QID) | ORAL | 0 refills | Status: DC | PRN

## 2018-07-17 MED ORDER — FERROUS SULFATE 325 (65 FE) MG PO TABS: 325.0000 mg | ORAL_TABLET | Freq: Every day | ORAL | 3 refills | Status: AC

## 2018-07-17 NOTE — Lactation Note (Signed)
This note was copied from a baby's chart. Lactation Consultation Note  Patient Name: Girl Ayrabella Labombard [id number] Date: 07/17/2018 Reason for consult: Follow-up assessment Mom reports that baby is now latching to both breasts.  Discussed milk coming to volume and the prevention and treatment of engorgement.  Mom has a breast pump at home.  Lactation outpatient services and support reviewed and encouraged prn.  Maternal Data    Feeding Feeding Type: Breast Fed  LATCH Score Latch: Grasps breast easily, tongue down, lips flanged, rhythmical sucking.  Audible Swallowing: A few with stimulation  Type of Nipple: Everted at rest and after stimulation  Comfort (Breast/Nipple): Soft / non-tender  Hold (Positioning): No assistance needed to correctly position infant at breast.  LATCH Score: 9  Interventions    Lactation Tools Discussed/Used     Consult Status Consult Status: Complete Follow-up type: Call as needed    Ave Filter 07/17/2018, 9:53 AM

## 2018-07-29 ENCOUNTER — Ambulatory Visit (INDEPENDENT_AMBULATORY_CARE_PROVIDER_SITE_OTHER): Payer: Medicaid Other | Admitting: General Practice

## 2018-07-29 VITALS — BP 119/84 | HR 74 | Ht 63.0 in | Wt 174.0 lb

## 2018-07-29 DIAGNOSIS — Z5189 Encounter for other specified aftercare: Secondary | ICD-10-CM

## 2018-07-29 NOTE — Progress Notes (Signed)
Patient presents to office today for wound check following repeat c-section on 2/3. Patient reports doing well since discharge. Incision is clean, dry & intact. Reviewed wound care and signs & symptoms of infection with patient. Patient verbalized understanding & will follow up on 3/3 for postpartum visit.  Koren Bound RN BSN 07/29/18

## 2018-07-29 NOTE — Progress Notes (Signed)
Chart reviewed for nurse visit. Agree with plan of care.   Starr Lake, [place] 07/29/2018 5:24 PM

## 2018-08-06 NOTE — Progress Notes (Signed)
Chart reviewed for nurse visit. Agree with plan of care.   Octivia Canion Lorraine, CNM 08/06/2018 9:01 AM   

## 2018-08-12 ENCOUNTER — Ambulatory Visit (INDEPENDENT_AMBULATORY_CARE_PROVIDER_SITE_OTHER): Payer: Medicaid Other | Admitting: Family Medicine

## 2018-08-12 ENCOUNTER — Encounter: Payer: Self-pay | Admitting: Family Medicine

## 2018-08-12 DIAGNOSIS — O99019 Anemia complicating pregnancy, unspecified trimester: Secondary | ICD-10-CM

## 2018-08-12 DIAGNOSIS — D696 Thrombocytopenia, unspecified: Secondary | ICD-10-CM

## 2018-08-12 DIAGNOSIS — Z1389 Encounter for screening for other disorder: Secondary | ICD-10-CM

## 2018-08-12 LAB — CBC
Hematocrit: 35.8 % (ref 34.0–46.6)
Hemoglobin: 11.4 g/dL (ref 11.1–15.9)
MCH: 25.1 pg — ABNORMAL LOW (ref 26.6–33.0)
MCHC: 31.8 g/dL (ref 31.5–35.7)
MCV: 79 fL (ref 79–97)
Platelets: 150 10*3/uL (ref 150–450)
RBC: 4.55 x10E6/uL (ref 3.77–5.28)
RDW: 16.7 % — ABNORMAL HIGH (ref 11.7–15.4)
WBC: 4.8 10*3/uL (ref 3.4–10.8)

## 2018-08-12 NOTE — Progress Notes (Signed)
Subjective:     Andrea Serrano is a 33 y.o. female who presents for a postpartum visit. She is 4 weeks postpartum following a low cervical transverse Cesarean section. I have fully reviewed the prenatal and intrapartum course. The delivery was at [redacted]w[redacted]d gestational weeks. Outcome: repeat cesarean section, low transverse incision. Anesthesia: epidural. Postpartum course has been uncomplicated. Baby's course has been uncomplicated. Baby is feeding by breast. Bleeding no bleeding. Bowel function is normal. Bladder function is normal. Patient is not sexually active. Contraception method is condoms. Postpartum depression screening: negative.  Patient had thrombocytopenia and anemia during pregnancy. Discharge hemoglobin 7.3. Received IV iron. Denies palpitations, light-headedness, shortness of breath or fatigue. Agreeable to repeat labs today.   Review of Systems A comprehensive review of systems was negative.   Objective:    There were no vitals taken for this visit.  General:  alert, cooperative, appears stated age and no distress   Breasts:  deferred  Lungs: Nonlabored breathing, normal effort  Heart:  RRR  Abdomen: soft, non-tender; bowel sounds normal; no masses,  no organomegaly. Incision well healed  GU: Deferred  Psych:  Normal behavior, insight and judgement  Neuro: Alert and oriented x 4. Normal gait. No gross cranial nerve abnormalities        Assessment:     Normal postpartum exam. Pap smear not done at today's visit.   Plan:    1. Contraception: condoms 2. Repeat CBC today  3. Follow up in: 1 year or as needed.

## 2018-08-15 ENCOUNTER — Telehealth: Payer: Self-pay

## 2018-08-15 NOTE — Telephone Encounter (Addendum)
-----   Message from Aura Camps, MD sent at 08/14/2018 10:22 PM EST ----- Regarding: Please notify patient about anemia Hello,  Please call Andrea Serrano and let her know that her anemia is resolved and she can stop taking iron. She can continue her prenatal vitamins until she is finished breastfeeding.  Thanks! Andrea Serrano   Notified pt provider recommendations.  Pt asked if she can start using a waist trainer.  Per Dr. Hulan Serrano it is safe to begin using the binder.  Pt verbalized understanding with no further questions.

## 2018-08-29 ENCOUNTER — Encounter: Payer: Self-pay | Admitting: *Deleted

## 2018-10-08 IMAGING — US US MFM OB DETAIL+14 WK
1 series · 12 of 28 positions shown · non-contrast
Comparison: none

[Series 1: us mfm ob detail+14 wk · 12 of 114 slices shown]
[im 5/114]
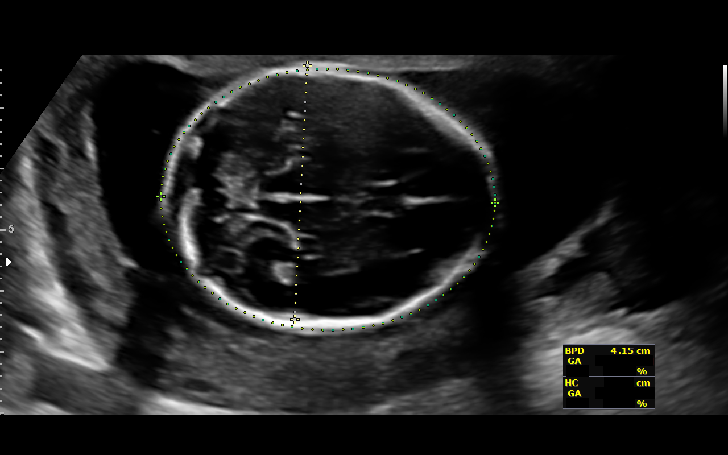
[im 13/114]
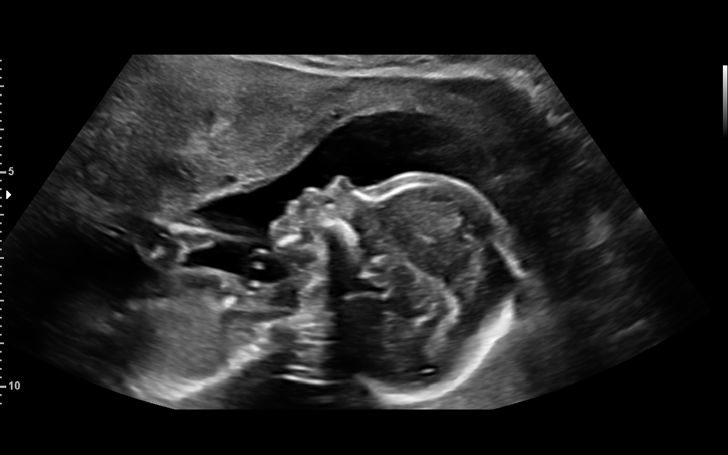
[im 21/114]
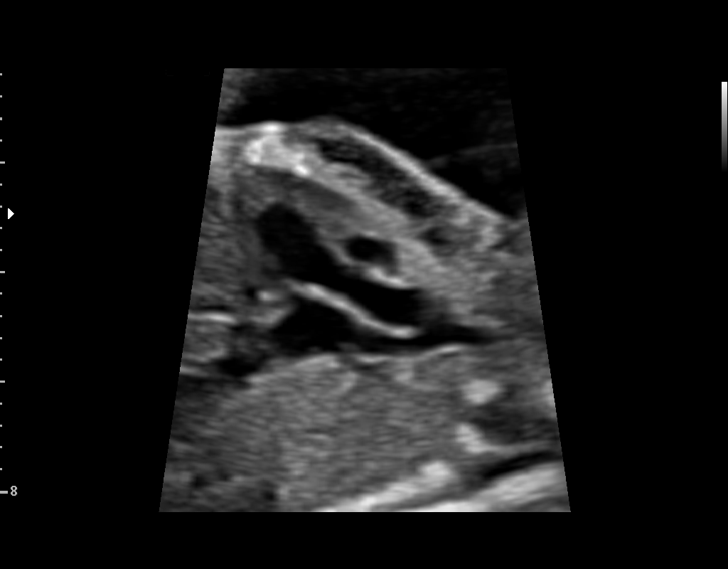
[im 34/114]
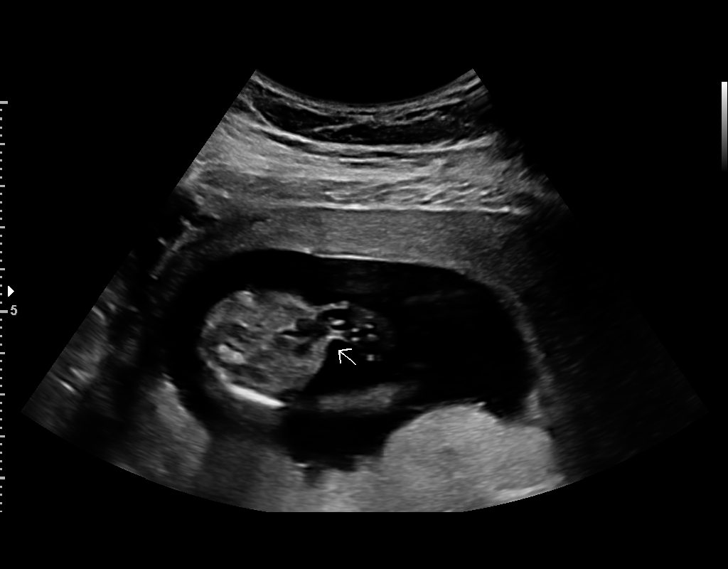
[im 42/114]
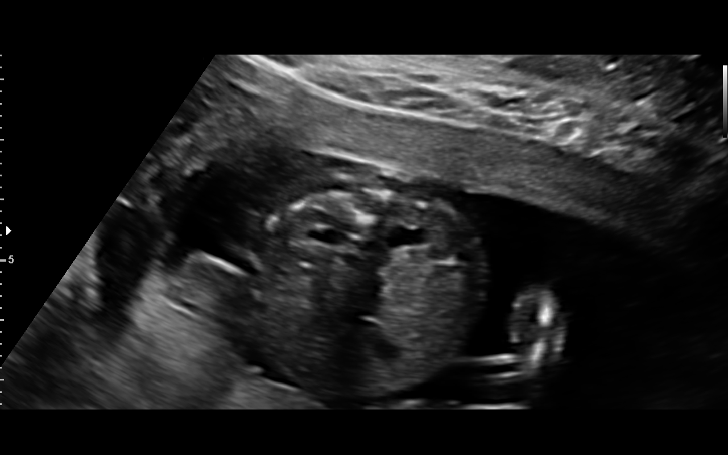
[im 51/114]
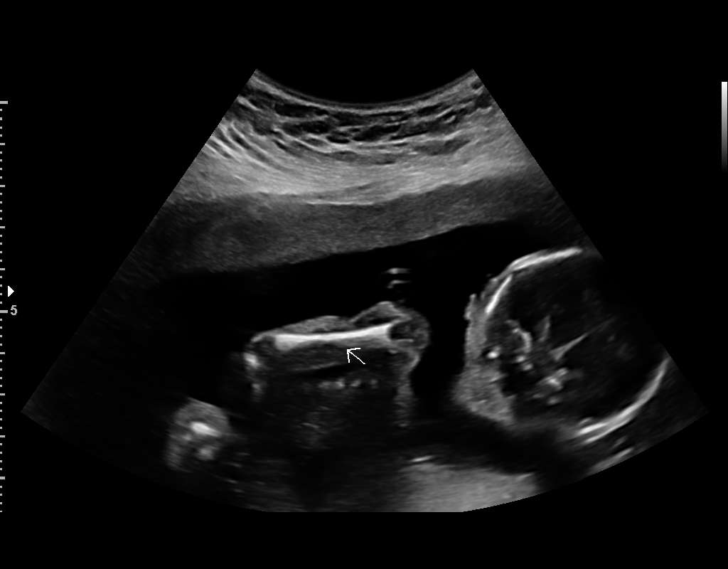
[im 63/114]
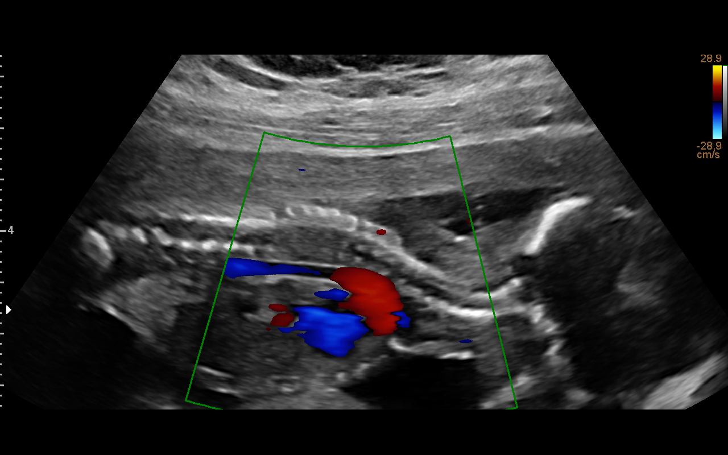
[im 72/114]
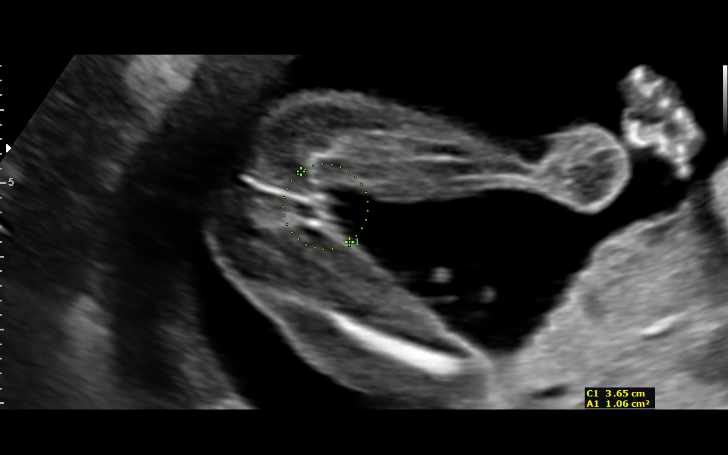
[im 80/114]
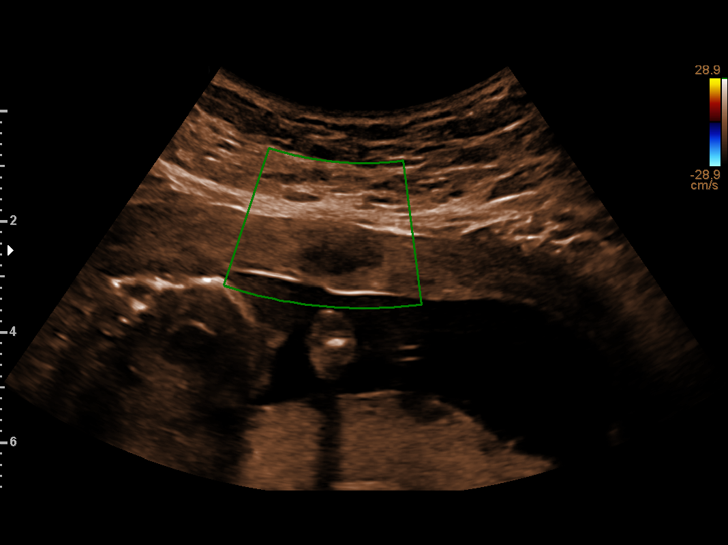
[im 93/114]
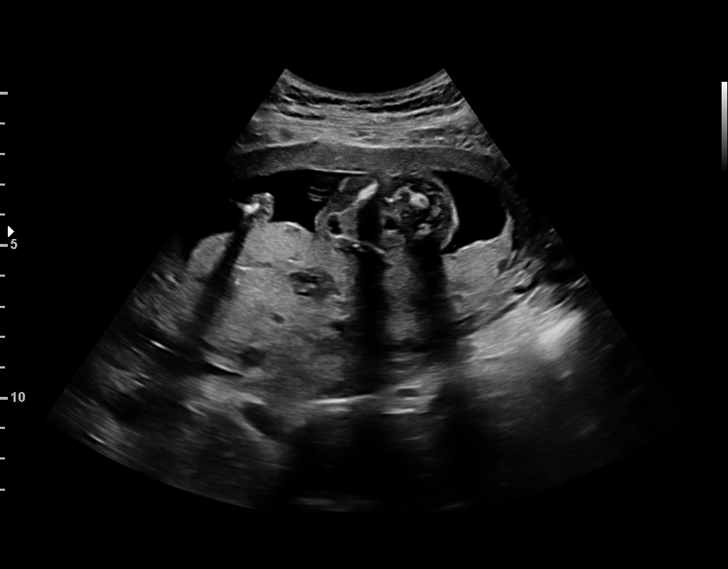
[im 101/114]
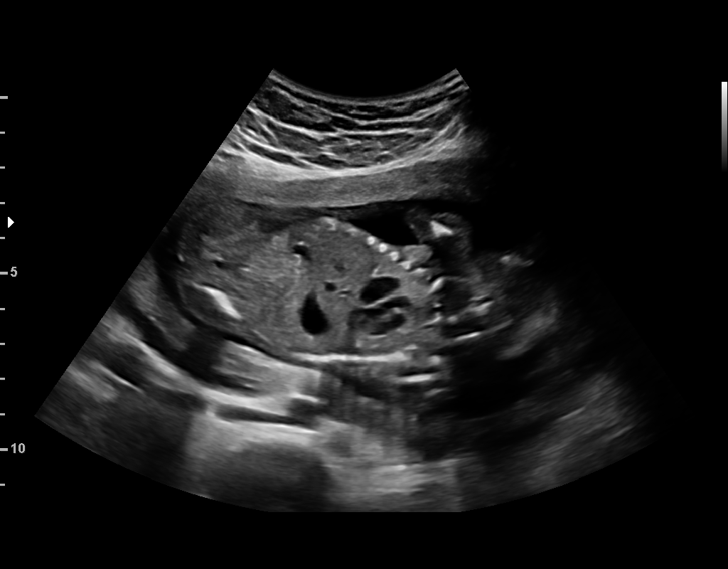
[im 109/114]
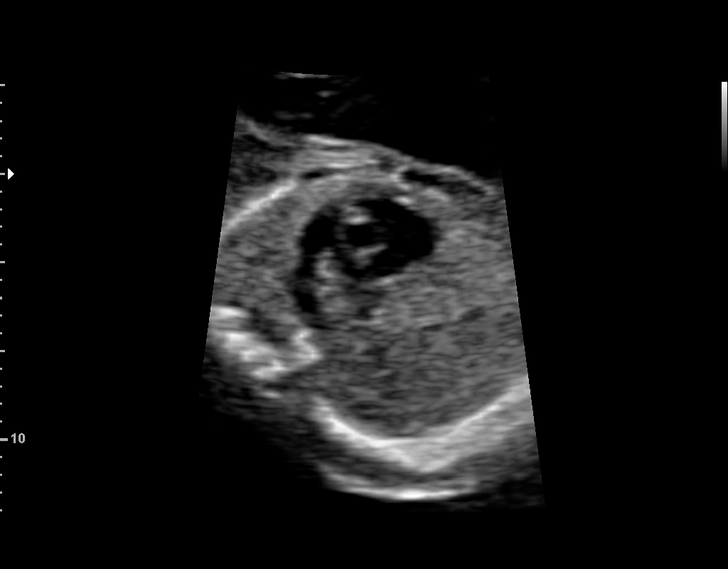

[12 of 28 positions shown; findings below may reference images not displayed]

Indications

Hepatitis B complicating pregnancy (Dx         O98.419,
6436)
19 weeks gestation of pregnancy
History of cesarean delivery, currently
pregnant
Thrombocytopenia affecting pregnancy,          O99.119,
antepartum (01/01/18 Platelets 129)
Encounter for antenatal screening for
malformations (Pt refused genetic testing
01/01/18)
Vital Signs

BMI:         31.17        Pulse:  83
BP:          106/67
Fetal Evaluation

Num Of Fetuses:         1
Fetal Heart Rate(bpm):  146
Cardiac Activity:       Observed
Presentation:           Cephalic
Placenta:               Posterior
P. Cord Insertion:      Visualized

Amniotic Fluid
AFI FV:      Within normal limits

Largest Pocket(cm)
5.24
Comment:    Persistent LUS contraction; unable to document placental edge
location with relevance to cervical internal os.
Biometry

BPD:        41  mm     G. Age:  18w 3d         21  %    CI:        71.82   %    70 - 86
FL/HC:      19.5   %    16.1 -
HC:       154   mm     G. Age:  18w 3d         12  %    HC/AC:      1.24        1.09 -
AC:      124.5  mm     G. Age:  18w 0d         16  %    FL/BPD:     73.4   %
FL:       30.1  mm     G. Age:  19w 2d         49  %    FL/AC:      24.2   %    20 - 24
HUM:      27.4  mm     G. Age:  18w 5d         40  %
CER:      17.9  mm     G. Age:  17w 6d         15  %
NFT:       4.3  mm

LV:        5.6  mm
CM:        3.6  mm

Est. FW:     249  gm      0 lb 9 oz     35  %
OB History

Gravidity:    4         Term:   1
TOP:          2        Living:  1
Gestational Age

LMP:           19w 1d        Date:  09/30/17                 EDD:   07/07/18
U/S Today:     18w 4d                                        EDD:   07/11/18
Best:          19w 1d     Det. By:  LMP  (09/30/17)          EDD:   07/07/18
Anatomy

Cranium:               Appears normal         Aortic Arch:            Appears normal
Cavum:                 Appears normal         Ductal Arch:            Appears normal
Ventricles:            Appears normal         Diaphragm:              Appears normal
Choroid Plexus:        Appears normal         Stomach:                Appears normal, left
sided
Cerebellum:            Appears normal         Abdomen:                Appears normal
Posterior Fossa:       Appears normal         Abdominal Wall:         Appears nml (cord
insert, abd wall)
Nuchal Fold:           Appears normal         Cord Vessels:           Appears normal (3
vessel cord)
Face:                  Appears normal         Kidneys:                Appear normal
(orbits and profile)
Lips:                  Appears normal         Bladder:                Appears normal
Thoracic:              Appears normal         Spine:                  Appears normal
Heart:                 Appears normal         Upper Extremities:      Appears normal
(4CH, axis, and situs
RVOT:                  Appears normal         Lower Extremities:      Appears normal
LVOT:                  Appears normal

Other:  Fetus appears to be female. Heels visualized. Nasal bone visualized.
Technically difficult due to fetal position.
Cervix Uterus Adnexa

Cervix
Length:           4.56  cm.
Normal appearance by transabdominal scan.
Uterus
Multiple fibroids noted, see table below.  3 largest measured.

Left Ovary
Not visualized.

Right Ovary
Size(cm)       4.2  x   2.8    x  2.7       Vol(ml):
Within normal limits.
Myomas

Site                     L(cm)      W(cm)      D(cm)      Location
Anterior
Posterior
Anterior

Blood Flow                 RI        PI       Comments

Comments

U/S images reviewed. Findings reviewed with patient.
Appropriate fetal growth is noted.  No fetal abnormalities are
seen.  Multiple small fibroids noted.
Questions answered.
10 minutes spent face to face.
Recommendations: 1) Completion of anatomy and F/U
growth in 4 weeks and placental localzation
Recommendations

1) Completion of anatomy and F/U growth in 4 weeks and
placental localzation

## 2020-09-10 ENCOUNTER — Other Ambulatory Visit: Payer: Self-pay

## 2020-09-10 ENCOUNTER — Encounter (HOSPITAL_BASED_OUTPATIENT_CLINIC_OR_DEPARTMENT_OTHER): Payer: Self-pay | Admitting: *Deleted

## 2020-09-10 ENCOUNTER — Emergency Department (HOSPITAL_BASED_OUTPATIENT_CLINIC_OR_DEPARTMENT_OTHER)
Admission: EM | Admit: 2020-09-10 | Discharge: 2020-09-11 | Disposition: A | Discharge status: Home / Self Care | Payer: Managed Care, Other (non HMO) | Attending: Emergency Medicine | Admitting: Emergency Medicine

## 2020-09-10 DIAGNOSIS — G44209 Tension-type headache, unspecified, not intractable: Secondary | ICD-10-CM

## 2020-09-10 DIAGNOSIS — R140 Abdominal distension (gaseous): Secondary | ICD-10-CM

## 2020-09-10 DIAGNOSIS — E860 Dehydration: Secondary | ICD-10-CM

## 2020-09-10 LAB — CBC WITH DIFFERENTIAL/PLATELET
Abs Immature Granulocytes: 0.01 10*3/uL (ref 0.00–0.07)
Basophils Absolute: 0 10*3/uL (ref 0.0–0.1)
Basophils Relative: 0 %
Eosinophils Absolute: 0 10*3/uL (ref 0.0–0.5)
Eosinophils Relative: 1 %
HCT: 38.8 % (ref 36.0–46.0)
Hemoglobin: 12.6 g/dL (ref 12.0–15.0)
Immature Granulocytes: 0 %
Lymphocytes Relative: 23 %
Lymphs Abs: 1 10*3/uL (ref 0.7–4.0)
MCH: 25.7 pg — ABNORMAL LOW (ref 26.0–34.0)
MCHC: 32.5 g/dL (ref 30.0–36.0)
MCV: 79 fL — ABNORMAL LOW (ref 80.0–100.0)
Monocytes Absolute: 0.3 10*3/uL (ref 0.1–1.0)
Monocytes Relative: 7 %
Neutro Abs: 3.1 10*3/uL (ref 1.7–7.7)
Neutrophils Relative %: 69 %
Platelets: 148 10*3/uL — ABNORMAL LOW (ref 150–400)
RBC: 4.91 MIL/uL (ref 3.87–5.11)
RDW: 13.9 % (ref 11.5–15.5)
WBC: 4.4 10*3/uL (ref 4.0–10.5)
nRBC: 0 % (ref 0.0–0.2)

## 2020-09-10 LAB — COMPREHENSIVE METABOLIC PANEL WITH GFR
ALT: 45 U/L — ABNORMAL HIGH (ref 0–44)
AST: 32 U/L (ref 15–41)
Albumin: 4.1 g/dL (ref 3.5–5.0)
Alkaline Phosphatase: 45 U/L (ref 38–126)
Anion gap: 7 (ref 5–15)
BUN: 13 mg/dL (ref 6–20)
CO2: 24 mmol/L (ref 22–32)
Calcium: 9 mg/dL (ref 8.9–10.3)
Chloride: 104 mmol/L (ref 98–111)
Creatinine, Ser: 0.67 mg/dL (ref 0.44–1.00)
GFR, Estimated: >60 mL/min
Glucose, Bld: 96 mg/dL (ref 70–99)
Potassium: 3.9 mmol/L (ref 3.5–5.1)
Sodium: 135 mmol/L (ref 135–145)
Total Bilirubin: 0.6 mg/dL (ref 0.3–1.2)
Total Protein: 7.7 g/dL (ref 6.5–8.1)

## 2020-09-10 LAB — URINALYSIS, ROUTINE W REFLEX MICROSCOPIC
Bilirubin Urine: NEGATIVE
Glucose, UA: NEGATIVE mg/dL
Ketones, ur: NEGATIVE mg/dL
Leukocytes,Ua: NEGATIVE
Nitrite: NEGATIVE
Protein, ur: NEGATIVE mg/dL
Specific Gravity, Urine: >1.03 — ABNORMAL HIGH (ref 1.005–1.030)
pH: 5.5 (ref 5.0–8.0)

## 2020-09-10 LAB — URINALYSIS, MICROSCOPIC (REFLEX)

## 2020-09-10 LAB — PREGNANCY, URINE: Preg Test, Ur: NEGATIVE

## 2020-09-10 LAB — LIPASE, BLOOD: Lipase: 28 U/L (ref 11–51)

## 2020-09-10 NOTE — ED Provider Notes (Signed)
MSE was initiated and I personally evaluated the patient and placed orders (if any) at  10:00 PM on September 10, 2020.  The patient appears stable so that the remainder of the MSE may be completed by another provider.   Gareth Morgan, MD 09/10/20 2200

## 2020-09-10 NOTE — ED Triage Notes (Signed)
Pt reports feeling full and bloated. Denies chest/abd pain. States she has not been eating today and when she burps it smells bad. Reports headache

## 2020-09-11 ENCOUNTER — Emergency Department (HOSPITAL_BASED_OUTPATIENT_CLINIC_OR_DEPARTMENT_OTHER): Payer: Managed Care, Other (non HMO)

## 2020-09-11 DIAGNOSIS — R140 Abdominal distension (gaseous): Secondary | ICD-10-CM | POA: Diagnosis not present

## 2020-09-11 MED ORDER — SIMETHICONE 40 MG/0.6ML PO SUSP (UNIT DOSE)
40.0000 mg | Freq: Once | ORAL | Status: AC
  Administered 2020-09-11: 40 mg via ORAL
  Filled 2020-09-11: qty 0.6

## 2020-09-11 MED ORDER — KETOROLAC TROMETHAMINE 15 MG/ML IJ SOLN
15.0000 mg | Freq: Once | INTRAMUSCULAR | Status: AC
  Administered 2020-09-11: 15 mg via INTRAVENOUS
  Filled 2020-09-11: qty 1

## 2020-09-11 MED ORDER — SIMETHICONE 80 MG PO CHEW: 80.0000 mg | CHEWABLE_TABLET | Freq: Four times a day (QID) | ORAL | 0 refills | Status: AC | PRN

## 2020-09-11 MED ORDER — SODIUM CHLORIDE 0.9 % IV BOLUS
1000.0000 mL | Freq: Once | INTRAVENOUS | Status: AC
  Administered 2020-09-11: 1000 mL via INTRAVENOUS

## 2020-09-11 NOTE — ED Provider Notes (Signed)
[facility] Provider Note: Georgena Spurling, MD, FACEP  CSN: [id number] MRN: [id number] ARRIVAL: 09/10/20 at 2058 ROOM: [place]  09/11/20 1:14 AM Andrea Serrano is a 35 y.o. female with 24 hours of feeling bloated.  She has not had an appetite and has had very little liquid in no solid intake over that time.  She denies nausea, vomiting or diarrhea.  She states she has been having foul-smelling burps as well as foul-smelling flatulence.  She denies any abdominal pain.  Symptoms are moderate and nothing makes them better or worse.  She also has a headache which she rates as a 7 out of 10.   Past Medical History:  Diagnosis Date  . Constipation   . Hepatitis B   . Thrombocytopenia affecting pregnancy, antepartum [facility])     Past Surgical History:  Procedure Laterality Date  . CESAREAN SECTION N/A 12/31/2013   Procedure: Primary Cesarean Section with Delivery Baby Boy @ 0157,  [place] 8/9;  Surgeon: Florian Buff, MD;  Location: [facility];  Service: Obstetrics;  Laterality: N/A;  . CESAREAN SECTION N/A 07/15/2018   Procedure: CESAREAN SECTION;  Surgeon: Chancy Milroy, MD;  Location: [place];  Service: Obstetrics;  Laterality: N/A;  . TOOTH EXTRACTION      Family History  Problem Relation Age of Onset  . Hypertension Mother   . Hypertension Father   . Heart disease Father     Social History   Tobacco Use  . Smoking status: Never Smoker  . Smokeless tobacco: Never Used  Vaping Use  . Vaping Use: Never used  Substance Use Topics  . Alcohol use: Yes  . Drug use: No    Prior to Admission medications   Medication Sig Start Date End Date Taking? Authorizing Provider  simethicone (GAS-X) 80 MG chewable tablet Chew 1 tablet (80 mg total) by mouth every 6 (six) hours as needed (bloating for flatulence). 09/11/20  Yes Haruko Mersch, MD  ferrous sulfate 325 (65 FE) MG tablet Take 1 tablet (325 mg total) by  mouth daily. 07/17/18 07/17/19  Aura Camps, MD  Prenatal Multivit-Min-Fe-FA (PRENATAL VITAMINS) 0.8 MG tablet Take 1 tablet by mouth daily. 07/14/13   Kassie Mends, MD    Allergies Patient has no known allergies.   REVIEW OF SYSTEMS  Negative except as noted here or in the History of Present Illness.   PHYSICAL EXAMINATION  Initial Vital Signs Blood pressure 131/83, pulse 81, temperature 99.6 F (37.6 C), temperature source Oral, resp. rate 16, height 5\' 4"  (1.626 m), weight 83.9 kg, last menstrual period 08/31/2020, SpO2 100 %, not currently breastfeeding.  Examination General: Well-developed, well-nourished female in no acute distress; appearance consistent with age of record HENT: normocephalic; atraumatic Eyes: pupils equal, round and reactive to light; extraocular muscles intact Neck: supple Heart: regular rate and rhythm Lungs: clear to auscultation bilaterally Abdomen: soft; mildly distended; nontender; bowel sounds present Extremities: No deformity; full range of motion; pulses normal Neurologic: Awake, alert and oriented; motor function intact in all extremities and symmetric; no facial droop Skin: Warm and dry Psychiatric: Normal mood and affect   RESULTS  Summary of this visit's results, reviewed and interpreted by myself:   EKG Interpretation  Date/Time:    Ventricular Rate:    PR Interval:    QRS Duration:   QT Interval:    QTC Calculation:   R Axis:     Text  Interpretation:        Laboratory Studies: Results for orders placed or performed during the hospital encounter of 09/10/20 (from the past 24 hour(s))  Pregnancy, urine     Status: None   Collection Time: 09/10/20 10:16 PM  Result Value Ref Range   Preg Test, Ur NEGATIVE NEGATIVE  CBC with Differential     Status: Abnormal   Collection Time: 09/10/20 10:16 PM  Result Value Ref Range   WBC 4.4 4.0 - 10.5 K/uL   RBC 4.91 3.87 - 5.11 MIL/uL   Hemoglobin 12.6 12.0 - 15.0 g/dL   HCT 38.8 36.0 -  46.0 %   MCV 79.0 (L) 80.0 - 100.0 fL   MCH 25.7 (L) 26.0 - 34.0 pg   MCHC 32.5 30.0 - 36.0 g/dL   RDW 13.9 11.5 - 15.5 %   Platelets 148 (L) 150 - 400 K/uL   nRBC 0.0 0.0 - 0.2 %   Neutrophils Relative % 69 %   Neutro Abs 3.1 1.7 - 7.7 K/uL   Lymphocytes Relative 23 %   Lymphs Abs 1.0 0.7 - 4.0 K/uL   Monocytes Relative 7 %   Monocytes Absolute 0.3 0.1 - 1.0 K/uL   Eosinophils Relative 1 %   Eosinophils Absolute 0.0 0.0 - 0.5 K/uL   Basophils Relative 0 %   Basophils Absolute 0.0 0.0 - 0.1 K/uL   Immature Granulocytes 0 %   Abs Immature Granulocytes 0.01 0.00 - 0.07 K/uL  Comprehensive metabolic panel     Status: Abnormal   Collection Time: 09/10/20 10:16 PM  Result Value Ref Range   Sodium 135 135 - 145 mmol/L   Potassium 3.9 3.5 - 5.1 mmol/L   Chloride 104 98 - 111 mmol/L   CO2 24 22 - 32 mmol/L   Glucose, Bld 96 70 - 99 mg/dL   BUN 13 6 - 20 mg/dL   Creatinine, Ser 0.67 0.44 - 1.00 mg/dL   Calcium 9.0 8.9 - 10.3 mg/dL   Total Protein 7.7 6.5 - 8.1 g/dL   Albumin 4.1 3.5 - 5.0 g/dL   AST 32 15 - 41 U/L   ALT 45 (H) 0 - 44 U/L   Alkaline Phosphatase 45 38 - 126 U/L   Total Bilirubin 0.6 0.3 - 1.2 mg/dL   GFR, Estimated >60 >60 mL/min   Anion gap 7 5 - 15  Lipase, blood     Status: None   Collection Time: 09/10/20 10:16 PM  Result Value Ref Range   Lipase 28 11 - 51 U/L  Urinalysis, Routine w reflex microscopic Urine, Clean Catch     Status: Abnormal   Collection Time: 09/10/20 10:16 PM  Result Value Ref Range   Color, Urine YELLOW YELLOW   APPearance HAZY (A) CLEAR   Specific Gravity, Urine >1.030 (H) 1.005 - 1.030   pH 5.5 5.0 - 8.0   Glucose, UA NEGATIVE NEGATIVE mg/dL   Hgb urine dipstick TRACE (A) NEGATIVE   Bilirubin Urine NEGATIVE NEGATIVE   Ketones, ur NEGATIVE NEGATIVE mg/dL   Protein, ur NEGATIVE NEGATIVE mg/dL   Nitrite NEGATIVE NEGATIVE   Leukocytes,Ua NEGATIVE NEGATIVE  Urinalysis, Microscopic (reflex)     Status: Abnormal   Collection Time:  09/10/20 10:16 PM  Result Value Ref Range   RBC / HPF 0-5 0 - 5 RBC/hpf   WBC, UA 0-5 0 - 5 WBC/hpf   Bacteria, UA MANY (A) NONE SEEN   Squamous Epithelial / LPF 6-10 0 - 5  Mucus PRESENT    Imaging Studies: DG ABD ACUTE 2+V W 1V CHEST  Result Date: 09/11/2020 CLINICAL DATA:  Bloating EXAM: DG ABDOMEN ACUTE WITH 1 VIEW CHEST COMPARISON:  None. FINDINGS: There is no evidence of dilated bowel loops or free intraperitoneal air. No radiopaque calculi or other significant radiographic abnormality is seen. Heart size and mediastinal contours are within normal limits. Both lungs are clear. IMPRESSION: Negative abdominal radiographs.  No acute cardiopulmonary disease. Electronically Signed   By: Rolm Baptise M.D.   On: 09/11/2020 02:06    ED COURSE and MDM  Nursing notes, initial and subsequent vitals signs, including pulse oximetry, reviewed and interpreted by myself.  Vitals:   09/10/20 2357 09/11/20 0059 09/11/20 0200 09/11/20 0302  BP: 126/73 131/83 110/77 120/84  Pulse: 84 81 84 77  Resp: 16 16 16 19   Temp:      TempSrc:      SpO2: 100% 100% 100% 100%  Weight:      Height:       Medications  simethicone (MYLICON) 40 GQ/9.1QX suspension 40 mg (has no administration in time range)  sodium chloride 0.9 % bolus 1,000 mL (0 mLs Intravenous Stopped 09/11/20 0301)  ketorolac (TORADOL) 15 MG/ML injection 15 mg (15 mg Intravenous Given 09/11/20 0144)   3:17 AM Patient feeling better after IV fluids and Toradol for headache.  Bloating somewhat improved as well.  We will give a dose of simethicone as she may have some gas discomfort.  No evidence of obstruction or constipation on plain films.   PROCEDURES  Procedures   ED DIAGNOSES     ICD-10-CM   1. Abdominal bloating  R14.0   2. Dehydration  E86.0   3. Acute non intractable tension-type headache  G44.209        Srihari Shellhammer, Jenny Reichmann, MD 09/11/20 [phone]

## 2020-09-11 NOTE — ED Notes (Signed)
Pt to radiology. NAD. Via wheelchair.

## 2023-02-22 ENCOUNTER — Encounter (HOSPITAL_BASED_OUTPATIENT_CLINIC_OR_DEPARTMENT_OTHER): Payer: Self-pay | Admitting: Emergency Medicine

## 2023-02-22 ENCOUNTER — Emergency Department (HOSPITAL_BASED_OUTPATIENT_CLINIC_OR_DEPARTMENT_OTHER)
Admission: EM | Admit: 2023-02-22 | Discharge: 2023-02-22 | Disposition: A | Discharge status: Home / Self Care | Payer: Medicaid Other | Attending: Emergency Medicine | Admitting: Emergency Medicine

## 2023-02-22 DIAGNOSIS — R1013 Epigastric pain: Secondary | ICD-10-CM | POA: Insufficient documentation

## 2023-02-22 DIAGNOSIS — Z1152 Encounter for screening for COVID-19: Secondary | ICD-10-CM | POA: Diagnosis not present

## 2023-02-22 DIAGNOSIS — R1010 Upper abdominal pain, unspecified: Secondary | ICD-10-CM | POA: Diagnosis not present

## 2023-02-22 LAB — URINALYSIS, ROUTINE W REFLEX MICROSCOPIC
Bilirubin Urine: NEGATIVE
Glucose, UA: NEGATIVE mg/dL
Hgb urine dipstick: NEGATIVE
Ketones, ur: NEGATIVE mg/dL
Leukocytes,Ua: NEGATIVE
Nitrite: NEGATIVE
Specific Gravity, Urine: 1.023 (ref 1.005–1.030)
pH: 7.5 (ref 5.0–8.0)

## 2023-02-22 LAB — COMPREHENSIVE METABOLIC PANEL
ALT: 25 U/L (ref 0–44)
AST: 20 U/L (ref 15–41)
Albumin: 4.3 g/dL (ref 3.5–5.0)
Alkaline Phosphatase: 46 U/L (ref 38–126)
Anion gap: 7 (ref 5–15)
BUN: 10 mg/dL (ref 6–20)
CO2: 26 mmol/L (ref 22–32)
Calcium: 9.4 mg/dL (ref 8.9–10.3)
Chloride: 103 mmol/L (ref 98–111)
Creatinine, Ser: 0.72 mg/dL (ref 0.44–1.00)
GFR, Estimated: >60 mL/min (ref >60)
Glucose, Bld: 89 mg/dL (ref 70–99)
Potassium: 4.1 mmol/L (ref 3.5–5.1)
Sodium: 136 mmol/L (ref 135–145)
Total Bilirubin: 0.5 mg/dL (ref 0.3–1.2)
Total Protein: 7.7 g/dL (ref 6.5–8.1)

## 2023-02-22 LAB — CBC
HCT: 38.1 % (ref 36.0–46.0)
Hemoglobin: 12.3 g/dL (ref 12.0–15.0)
MCH: 24.6 pg — ABNORMAL LOW (ref 26.0–34.0)
MCHC: 32.3 g/dL (ref 30.0–36.0)
MCV: 76 fL — ABNORMAL LOW (ref 80.0–100.0)
Platelets: 188 10*3/uL (ref 150–400)
RBC: 5.01 MIL/uL (ref 3.87–5.11)
RDW: 14.6 % (ref 11.5–15.5)
WBC: 6.5 10*3/uL (ref 4.0–10.5)
nRBC: 0 % (ref 0.0–0.2)

## 2023-02-22 LAB — LIPASE, BLOOD: Lipase: 19 U/L (ref 11–51)

## 2023-02-22 LAB — SARS CORONAVIRUS 2 BY RT PCR: SARS Coronavirus 2 by RT PCR: NEGATIVE

## 2023-02-22 LAB — PREGNANCY, URINE: Preg Test, Ur: NEGATIVE

## 2023-02-22 MED ORDER — ALUM & MAG HYDROXIDE-SIMETH 200-200-20 MG/5ML PO SUSP
15.0000 mL | Freq: Once | ORAL | Status: AC
  Administered 2023-02-22: 15 mL via ORAL
  Filled 2023-02-22: qty 30

## 2023-02-22 MED ORDER — FAMOTIDINE 20 MG PO TABS
20.0000 mg | ORAL_TABLET | Freq: Once | ORAL | Status: AC
  Administered 2023-02-22: 20 mg via ORAL
  Filled 2023-02-22: qty 1

## 2023-02-22 MED ORDER — PANTOPRAZOLE SODIUM 20 MG PO TBEC: 20.0000 mg | DELAYED_RELEASE_TABLET | Freq: Every day | ORAL | 2 refills | Status: AC

## 2023-02-22 MED ORDER — SODIUM CHLORIDE 0.9 % IV BOLUS
1000.0000 mL | Freq: Once | INTRAVENOUS | Status: AC
  Administered 2023-02-22: 1000 mL via INTRAVENOUS

## 2023-02-22 MED ORDER — ONDANSETRON HCL 4 MG/2ML IJ SOLN
4.0000 mg | Freq: Once | INTRAMUSCULAR | Status: AC
  Administered 2023-02-22: 4 mg via INTRAVENOUS
  Filled 2023-02-22: qty 2

## 2023-02-22 NOTE — ED Triage Notes (Signed)
Upper abdo pain, started last night worse after eating, some nausea/vomiting and soft stools

## 2023-02-22 NOTE — ED Notes (Signed)
Pt given discharge instructions and reviewed prescriptions. Opportunities given for questions. Pt verbalizes understanding. PIV removed x1. Jillyn Hidden, RN

## 2023-02-22 NOTE — ED Provider Notes (Signed)
[place] EMERGENCY DEPARTMENT AT [facility] Provider Note   CSN: [id number] Arrival date & time: 02/22/23  1246     History  Chief Complaint  Patient presents with   Abdominal Pain    Andrea Serrano is a 37 y.o. female.   Abdominal Pain   37 year old female presents emergency department with complaints of upper middle abdominal pain.  Patient states that symptoms began last night when she was eating.  Describes pain as burning.  Reports 2 episodes of emesis or so since symptom onset.  States that for the past few days has been drinking just lemon water for dietary reasons and feels like that may have irritated her symptoms.  Denies any history of GERD.  States that she has also had softer bowel movements but denies any diarrhea.  Denies any fever, chills, hematemesis, urinary symptoms, change in bowel habits.  States he has a history of C-section but without any other abdominal surgery.  Home Medications Prior to Admission medications   Medication Sig Start Date End Date Taking? Authorizing Provider  pantoprazole (PROTONIX) 20 MG tablet Take 1 tablet (20 mg total) by mouth daily. 02/22/23  Yes [name] A, PA  ferrous sulfate 325 (65 FE) MG tablet Take 1 tablet (325 mg total) by mouth daily. 07/17/18 07/17/19  Gwenevere Abbot, MD  Prenatal Multivit-Min-Fe-FA (PRENATAL VITAMINS) 0.8 MG tablet Take 1 tablet by mouth daily. 07/14/13   Vale Haven, MD  simethicone (GAS-X) 80 MG chewable tablet Chew 1 tablet (80 mg total) by mouth every 6 (six) hours as needed (bloating for flatulence). 09/11/20   Molpus, Jonny Ruiz, MD      Allergies    Patient has no known allergies.    Review of Systems   Review of Systems  Gastrointestinal:  Positive for abdominal pain.  All other systems reviewed and are negative.   Physical Exam Updated Vital Signs BP 121/89 (BP Location: Right Arm)   Pulse 74   Temp 98.7 F (37.1 C) (Oral)   Resp 18   LMP 01/31/2023 (Approximate)   SpO2 99%   Physical Exam Vitals and nursing note reviewed.  Constitutional:      General: She is not in acute distress.    Appearance: She is well-developed.  HENT:     Head: Normocephalic and atraumatic.  Eyes:     Conjunctiva/sclera: Conjunctivae normal.  Cardiovascular:     Rate and Rhythm: Normal rate and regular rhythm.     Heart sounds: No murmur heard. Pulmonary:     Effort: Pulmonary effort is normal. No respiratory distress.     Breath sounds: Normal breath sounds.  Abdominal:     Palpations: Abdomen is soft.     Tenderness: There is abdominal tenderness in the epigastric area. There is no right CVA tenderness, left CVA tenderness or guarding.     Comments: Very minimal epigastric tenderness to palpation.  Musculoskeletal:        General: No swelling.     Cervical back: Neck supple.  Skin:    General: Skin is warm and dry.     Capillary Refill: Capillary refill takes less than 2 seconds.  Neurological:     Mental Status: She is alert.  Psychiatric:        Mood and Affect: Mood normal.     ED Results / Procedures / Treatments   Labs (all labs ordered are listed, but only abnormal results are displayed) Labs Reviewed  CBC - Abnormal; Notable for the following components:  Result Value   MCV 76.0 (*)    MCH 24.6 (*)    All other components within normal limits  URINALYSIS, ROUTINE W REFLEX MICROSCOPIC - Abnormal; Notable for the following components:   Protein, ur TRACE (*)    All other components within normal limits  SARS CORONAVIRUS 2 BY RT PCR  LIPASE, BLOOD  COMPREHENSIVE METABOLIC PANEL  PREGNANCY, URINE    EKG None  Radiology No results found.  Procedures Procedures    Medications Ordered in ED Medications  sodium chloride 0.9 % bolus 1,000 mL (1,000 mLs Intravenous New Bag/Given 02/22/23 1414)  ondansetron (ZOFRAN) injection 4 mg (4 mg Intravenous Given 02/22/23 1412)  alum & mag hydroxide-simeth (MAALOX/MYLANTA) 200-200-20 MG/5ML suspension 15  mL (15 mLs Oral Given 02/22/23 1412)  famotidine (PEPCID) tablet 20 mg (20 mg Oral Given 02/22/23 1412)    ED Course/ Medical Decision Making/ A&P                                 Medical Decision Making Amount and/or Complexity of Data Reviewed Labs: ordered.  Risk OTC drugs. Prescription drug management.   This patient presents to the ED for concern of abdominal pain, this involves an extensive number of treatment options, and is a complaint that carries with it a high risk of complications and morbidity.  The differential diagnosis includes gastritis/esophagitis/GERD, PUD, pancreatitis, CBD pathology, cholecystitis, SBO/LBO, volvulus, ACS   Co morbidities that complicate the patient evaluation  See HPI   Additional history obtained:  Additional history obtained from EMR External records from outside source obtained and reviewed including hospital records   Lab Tests:  I Ordered, and personally interpreted labs.  The pertinent results include: No leukocytosis.  No evidence of anemia.  Placed within range.  No Electra abnormalities.  No transaminitis.  No renal dysfunction.  Lipase within normal limits.  Urine pregnancy negative.  Urinalysis consistent with trace protein but otherwise unremarkable.   Imaging Studies ordered:  N/a   Cardiac Monitoring: / EKG:  The patient was maintained on a cardiac monitor.  I personally viewed and interpreted the cardiac monitored which showed an underlying rhythm of: Sinus rhythm   Consultations Obtained:  N/a   Problem List / ED Course / Critical interventions / Medication management  Epigastric, pain I ordered medication including Pepcid, Zofran, Maalox, normal saline   Reevaluation of the patient after these medicines showed that the patient improved I have reviewed the patients home medicines and have made adjustments as needed   Social Determinants of Health:  Denies tobacco, illicit drug use   Test / Admission -  Considered:  Epigastric abdominal pain Vitals signs within normal range and stable throughout visit. Laboratory/imaging studies significant for: See above 37 year old female presents emergency department with complaints of epigastric abdominal pain that began last night.  Patient symptoms began when she was eating and states that for the past several days has been drinking mainly lemon water which she feels like is exacerbating her symptoms.  Describes burning type sensation in the upper part of her stomach which then radiates up into her chest and is worsened with consumption of certain food/liquids.  Symptoms not exertionally related per patient.  On exam, patient with very minimal epigastric tenderness to palpation but otherwise, benign exam.  Patient's labs reassuring.  Treated with GI cocktail with emergency department and did note significant improvement/near resolution of symptoms.  Suspect patient's symptoms are likely  secondary to GERD/gastritis/PUD.  Will treat with reflux medication and recommend lifestyle modifications and follow-up with primary care in the outpatient setting.  Further workup deemed unnecessary at this time.  Low suspicion for emergent intra-abdominal pathology.  Treatment plan discussed at length with patient and she acknowledged understanding was agreeable to said plan.  Patient overall well-appearing, afebrile in no acute distress. Worrisome signs and symptoms were discussed with the patient, and the patient acknowledged understanding to return to the ED if noticed. Patient was stable upon discharge.          Final Clinical Impression(s) / ED Diagnoses Final diagnoses:  Epigastric pain    Rx / DC Orders ED Discharge Orders          Ordered    pantoprazole (PROTONIX) 20 MG tablet  Daily        02/22/23 1451              Peter Garter, [place] 02/22/23 1536    Terrilee Files, MD 02/22/23 1735

## 2023-02-22 NOTE — Discharge Instructions (Signed)
As discussed, suspect your symptoms are likely secondary to reflux or GERD.  See information attached to your discharge papers regarding food/liquids that make symptoms worse.  Recommend avoiding these food/liquids.  Will begin you on a reflux medicine in the form of Protonix.  Recommend follow-up with primary care for reassessment of your symptoms.  Please not hesitate to return to emergency department if the worrisome signs and symptoms we discussed become apparent.

## 2024-03-10 ENCOUNTER — Other Ambulatory Visit: Payer: Self-pay | Admitting: Physician Assistant

## 2024-03-10 ENCOUNTER — Ambulatory Visit
Admission: RE | Admit: 2024-03-10 | Discharge: 2024-03-10 | Disposition: A | Discharge status: Home / Self Care | Source: Ambulatory Visit | Attending: Physician Assistant | Admitting: Physician Assistant

## 2024-03-10 DIAGNOSIS — M546 Pain in thoracic spine: Secondary | ICD-10-CM

## 2024-03-10 DIAGNOSIS — M7989 Other specified soft tissue disorders: Secondary | ICD-10-CM

## 2024-03-19 ENCOUNTER — Other Ambulatory Visit (HOSPITAL_BASED_OUTPATIENT_CLINIC_OR_DEPARTMENT_OTHER): Payer: Self-pay | Admitting: Physician Assistant

## 2024-03-19 ENCOUNTER — Encounter (HOSPITAL_BASED_OUTPATIENT_CLINIC_OR_DEPARTMENT_OTHER): Payer: Self-pay | Admitting: Physician Assistant

## 2024-03-19 DIAGNOSIS — M79604 Pain in right leg: Secondary | ICD-10-CM

## 2024-03-20 ENCOUNTER — Emergency Department (HOSPITAL_BASED_OUTPATIENT_CLINIC_OR_DEPARTMENT_OTHER)
Admission: EM | Admit: 2024-03-20 | Discharge: 2024-03-20 | Disposition: A | Discharge status: Home / Self Care | Attending: Emergency Medicine | Admitting: Emergency Medicine

## 2024-03-20 ENCOUNTER — Encounter (HOSPITAL_BASED_OUTPATIENT_CLINIC_OR_DEPARTMENT_OTHER): Payer: Self-pay

## 2024-03-20 ENCOUNTER — Other Ambulatory Visit: Payer: Self-pay

## 2024-03-20 DIAGNOSIS — I10 Essential (primary) hypertension: Secondary | ICD-10-CM | POA: Diagnosis not present

## 2024-03-20 DIAGNOSIS — R519 Headache, unspecified: Secondary | ICD-10-CM | POA: Diagnosis present

## 2024-03-20 DIAGNOSIS — R059 Cough, unspecified: Secondary | ICD-10-CM | POA: Insufficient documentation

## 2024-03-20 LAB — CBC
HCT: 33.8 % — ABNORMAL LOW (ref 36.0–46.0)
Hemoglobin: 11 g/dL — ABNORMAL LOW (ref 12.0–15.0)
MCH: 24.3 pg — ABNORMAL LOW (ref 26.0–34.0)
MCHC: 32.5 g/dL (ref 30.0–36.0)
MCV: 74.6 fL — ABNORMAL LOW (ref 80.0–100.0)
Platelets: 174 K/uL (ref 150–400)
RBC: 4.53 MIL/uL (ref 3.87–5.11)
RDW: 15 % (ref 11.5–15.5)
WBC: 5 K/uL (ref 4.0–10.5)
nRBC: 0 % (ref 0.0–0.2)

## 2024-03-20 LAB — HEPATIC FUNCTION PANEL
ALT: 19 U/L (ref 0–44)
AST: 17 U/L (ref 15–41)
Albumin: 4 g/dL (ref 3.5–5.0)
Alkaline Phosphatase: 56 U/L (ref 38–126)
Bilirubin, Direct: 0.2 mg/dL (ref 0.0–0.2)
Indirect Bilirubin: 0.3 mg/dL (ref 0.3–0.9)
Total Bilirubin: 0.4 mg/dL (ref 0.0–1.2)
Total Protein: 7.2 g/dL (ref 6.5–8.1)

## 2024-03-20 LAB — BASIC METABOLIC PANEL WITH GFR
Anion gap: 9 (ref 5–15)
BUN: 10 mg/dL (ref 6–20)
CO2: 25 mmol/L (ref 22–32)
Calcium: 9.1 mg/dL (ref 8.9–10.3)
Chloride: 103 mmol/L (ref 98–111)
Creatinine, Ser: 0.89 mg/dL (ref 0.44–1.00)
GFR, Estimated: >60 mL/min (ref >60)
Glucose, Bld: 98 mg/dL (ref 70–99)
Potassium: 3.9 mmol/L (ref 3.5–5.1)
Sodium: 137 mmol/L (ref 135–145)

## 2024-03-20 LAB — PREGNANCY, URINE: Preg Test, Ur: NEGATIVE

## 2024-03-20 NOTE — ED Triage Notes (Signed)
 Patient reports fluctuating blood pressure with headaches. She says she noticed it yesterday. She reports 169/102 at the highest and 115/76 at the lowest. She reports no history of blood pressure issues in the past. She denies chest pain or shortness of breath.

## 2024-03-20 NOTE — ED Provider Notes (Signed)
 [place] EMERGENCY DEPARTMENT AT [facility] Provider Note   CSN: [id number] Arrival date & time: 03/20/24  9280     Patient presents with: Fluctuating Blood Pressure   Jaslen Adcox is a 38 y.o. female.   HPI 38 yo female presents complaining of new onset high blood pressure.  PMD at [facility], cannot remember provider name.  Patient with history of hepatitis.  No other hsitory.  Patient has noted some headache, which she sometimes gets.  Headache resolved with headache. PMD told her to observe until next visit.  She told her ot come to hospital if bp getting higher.  This am 169/102.  Patient had cough at home for mother in law. BPs have been   Denies pregnancy.  No bc, periods regular, lmp 9/25 Patient was seen one month ago and denies hypertension at that time.  She reports being started on phentermine then.   Prior to Admission medications   Medication Sig Start Date End Date Taking? Authorizing Provider  cyclobenzaprine  (FLEXERIL ) 10 MG tablet Take 10 mg by mouth 3 (three) times daily. 03/02/24  Yes [provider]  phentermine 15 MG capsule Take 15 mg by mouth daily. 03/02/24  Yes [provider]  ferrous sulfate  325 (65 FE) MG tablet Take 1 tablet (325 mg total) by mouth daily. 07/17/18 07/17/19  Manus Colas, MD  pantoprazole  (PROTONIX ) 20 MG tablet Take 1 tablet (20 mg total) by mouth daily. 02/22/23   Silver Wonda LABOR, PA  Prenatal Multivit-Min-Fe-FA (PRENATAL VITAMINS) 0.8 MG tablet Take 1 tablet by mouth daily. 07/14/13   Almarie Cleon CROME, MD  simethicone  (GAS-X) 80 MG chewable tablet Chew 1 tablet (80 mg total) by mouth every 6 (six) hours as needed (bloating for flatulence). 09/11/20   Molpus, Norleen, MD    Allergies: Patient has no known allergies.    Review of Systems  Updated Vital Signs BP 130/86   Pulse 73   Temp 98.4 F (36.9 C) (Oral)   Resp 20   SpO2 100%   Physical Exam Vitals and nursing note reviewed.  Constitutional:       Appearance: She is obese.  HENT:     Head: Normocephalic and atraumatic.     Right Ear: External ear normal.     Left Ear: External ear normal.     Nose: Nose normal.     Mouth/Throat:     Pharynx: Oropharynx is clear.  Eyes:     Pupils: Pupils are equal, round, and reactive to light.  Cardiovascular:     Rate and Rhythm: Normal rate and regular rhythm.     Pulses: Normal pulses.  Pulmonary:     Effort: Pulmonary effort is normal.     Breath sounds: Normal breath sounds.  Abdominal:     General: Abdomen is flat. Bowel sounds are normal.     Palpations: Abdomen is soft.  Musculoskeletal:        General: Normal range of motion.  Skin:    General: Skin is warm.     Capillary Refill: Capillary refill takes less than 2 seconds.  Neurological:     General: No focal deficit present.     Mental Status: She is alert. Mental status is at baseline.     (all labs ordered are listed, but only abnormal results are displayed) Labs Reviewed  CBC - Abnormal; Notable for the following components:      Result Value   Hemoglobin 11.0 (*)    HCT 33.8 (*)  MCV 74.6 (*)    MCH 24.3 (*)    All other components within normal limits  BASIC METABOLIC PANEL WITH GFR  PREGNANCY, URINE  HEPATIC FUNCTION PANEL    EKG: EKG Interpretation Date/Time:  Friday March 20 2024 07:50:44 EDT Ventricular Rate:  79 PR Interval:  162 QRS Duration:  84 QT Interval:  375 QTC Calculation: 430 R Axis:   72  Text Interpretation: Sinus rhythm No significant change since last tracing Confirmed by Levander Houston [phone]) on 03/20/2024 8:19:52 AM  Radiology: No results found.   Procedures   Medications Ordered in the ED - No data to display                                  Medical Decision Making Amount and/or Complexity of Data Reviewed Labs: ordered.   38 yo female with high blood pressure noted yesterday on check up.  She checked bp at home and noted high and came in due to this.  She had  some headache loc but normal for her and resolved with acetaminophen .   Here BP 130s. Evalutated for end organ damage with EKG, labs No kidney injury or abnormal EKG findings. Discussed results withpatient Advised regarding keeping log and f/u with pmd Discussed return precautions and voices understanding.     Final diagnoses:  Hypertension, unspecified type    ED Discharge Orders     None          Levander Houston, MD 03/20/24 [id number]

## 2024-03-20 NOTE — ED Notes (Signed)
 Pt discharged home and given discharge paperwork. Opportunities given for questions. Pt verbalizes understanding. PIV removed x1. Bethena Powell SAUNDERS , RN

## 2024-03-20 NOTE — Discharge Instructions (Signed)
 Return if worse especially severe headache, chest pain, or weakness. Keep log as discussed and recheck with your doctor next week.

## 2024-04-04 ENCOUNTER — Ambulatory Visit (HOSPITAL_BASED_OUTPATIENT_CLINIC_OR_DEPARTMENT_OTHER): Admission: RE | Admit: 2024-04-04 | Source: Ambulatory Visit

## 2024-04-21 ENCOUNTER — Ambulatory Visit (HOSPITAL_COMMUNITY): Admission: RE | Admit: 2024-04-21 | Discharge: 2024-04-21 | Attending: Vascular Surgery

## 2024-04-21 ENCOUNTER — Other Ambulatory Visit (HOSPITAL_COMMUNITY): Payer: Self-pay | Admitting: Physician Assistant

## 2024-04-21 ENCOUNTER — Ambulatory Visit (HOSPITAL_COMMUNITY)
Admission: RE | Admit: 2024-04-21 | Discharge: 2024-04-21 | Disposition: A | Discharge status: Home / Self Care | Source: Ambulatory Visit | Attending: Vascular Surgery | Admitting: Vascular Surgery

## 2024-04-21 DIAGNOSIS — M79605 Pain in left leg: Secondary | ICD-10-CM

## 2024-04-21 DIAGNOSIS — R52 Pain, unspecified: Secondary | ICD-10-CM | POA: Insufficient documentation

## 2024-04-21 DIAGNOSIS — M79604 Pain in right leg: Secondary | ICD-10-CM | POA: Insufficient documentation

## 2024-04-22 LAB — VAS US ABI WITH/WO TBI
Left ABI: 1.13
Right ABI: 1.2
